# Patient Record
Sex: Female | Born: 1950 | Race: Black or African American | Hispanic: No | Marital: Married | Smoking: Never smoker
Health system: Southern US, Community
[De-identification: ages and names within clinical notes are randomized; demographics above are authoritative.]

## PROBLEM LIST (undated history)

## (undated) DIAGNOSIS — I10 Essential (primary) hypertension: Secondary | ICD-10-CM

## (undated) HISTORY — DX: Essential (primary) hypertension: I10

## (undated) HISTORY — PX: FINGER SURGERY: SHX640

---

## 2013-04-08 ENCOUNTER — Emergency Department (HOSPITAL_COMMUNITY): Payer: No Typology Code available for payment source

## 2013-04-08 ENCOUNTER — Encounter (HOSPITAL_COMMUNITY): Payer: Self-pay | Admitting: Emergency Medicine

## 2013-04-08 ENCOUNTER — Emergency Department (HOSPITAL_COMMUNITY)
Admission: EM | Admit: 2013-04-08 | Discharge: 2013-04-08 | Disposition: A | Discharge status: Home / Self Care | Payer: No Typology Code available for payment source | Attending: Emergency Medicine | Admitting: Emergency Medicine

## 2013-04-08 DIAGNOSIS — S335XXA Sprain of ligaments of lumbar spine, initial encounter: Secondary | ICD-10-CM | POA: Insufficient documentation

## 2013-04-08 DIAGNOSIS — S39012A Strain of muscle, fascia and tendon of lower back, initial encounter: Secondary | ICD-10-CM

## 2013-04-08 DIAGNOSIS — S59909A Unspecified injury of unspecified elbow, initial encounter: Secondary | ICD-10-CM | POA: Insufficient documentation

## 2013-04-08 DIAGNOSIS — S0993XA Unspecified injury of face, initial encounter: Secondary | ICD-10-CM | POA: Insufficient documentation

## 2013-04-08 DIAGNOSIS — I10 Essential (primary) hypertension: Secondary | ICD-10-CM | POA: Insufficient documentation

## 2013-04-08 DIAGNOSIS — S6990XA Unspecified injury of unspecified wrist, hand and finger(s), initial encounter: Secondary | ICD-10-CM | POA: Insufficient documentation

## 2013-04-08 DIAGNOSIS — Y92410 Unspecified street and highway as the place of occurrence of the external cause: Secondary | ICD-10-CM | POA: Insufficient documentation

## 2013-04-08 DIAGNOSIS — Y9389 Activity, other specified: Secondary | ICD-10-CM | POA: Insufficient documentation

## 2013-04-08 MED ORDER — IBUPROFEN 800 MG PO TABS: 800.0000 mg | ORAL_TABLET | Freq: Four times a day (QID) | ORAL | Status: DC | PRN

## 2013-04-08 MED ORDER — HYDROCODONE-ACETAMINOPHEN 5-325 MG PO TABS
1.0000 | ORAL_TABLET | Freq: Once | ORAL | Status: DC
  Filled 2013-04-08: qty 1

## 2013-04-08 MED ORDER — LISINOPRIL 10 MG PO TABS: 10.0000 mg | ORAL_TABLET | Freq: Every day | ORAL | Status: DC

## 2013-04-08 MED ORDER — IBUPROFEN 800 MG PO TABS
800.0000 mg | ORAL_TABLET | Freq: Once | ORAL | Status: AC
  Administered 2013-04-08: 800 mg via ORAL
  Filled 2013-04-08: qty 1

## 2013-04-08 MED ORDER — LISINOPRIL 10 MG PO TABS
10.0000 mg | ORAL_TABLET | Freq: Once | ORAL | Status: AC
  Administered 2013-04-08: 10 mg via ORAL
  Filled 2013-04-08: qty 1
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
## 2013-04-11 NOTE — ED Provider Notes (Signed)
Medical screening examination/treatment/procedure(s) were performed by non-physician practitioner and as supervising physician I was immediately available for consultation/collaboration.    L , MD 04/11/13 1056 

## 2013-05-10 ENCOUNTER — Encounter (HOSPITAL_COMMUNITY): Payer: Self-pay | Admitting: Emergency Medicine

## 2013-05-10 ENCOUNTER — Emergency Department (HOSPITAL_COMMUNITY)
Admission: EM | Admit: 2013-05-10 | Discharge: 2013-05-10 | Disposition: A | Discharge status: Home / Self Care | Payer: BC Managed Care – PPO | Source: Home / Self Care | Attending: Family Medicine | Admitting: Family Medicine

## 2013-05-10 DIAGNOSIS — I10 Essential (primary) hypertension: Secondary | ICD-10-CM

## 2013-05-10 LAB — BASIC METABOLIC PANEL WITH GFR
BUN: 16 mg/dL (ref 6–23)
CO2: 27 meq/L (ref 19–32)
Calcium: 9 mg/dL (ref 8.4–10.5)
Chloride: 104 meq/L (ref 96–112)
Creatinine, Ser: 0.82 mg/dL (ref 0.50–1.10)
GFR calc Af Amer: 88 mL/min — ABNORMAL LOW
GFR calc non Af Amer: 76 mL/min — ABNORMAL LOW
Glucose, Bld: 92 mg/dL (ref 70–99)
Potassium: 4.5 meq/L (ref 3.7–5.3)
Sodium: 141 meq/L (ref 137–147)

## 2013-05-10 MED ORDER — LISINOPRIL 20 MG PO TABS: 20.0000 mg | ORAL_TABLET | Freq: Every day | ORAL | Status: DC
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
## 2013-05-10 NOTE — ED Provider Notes (Signed)
CSN: [id number]     Arrival date & time 05/10/13  1814 History   First MD Initiated Contact with Patient 05/10/13 1927     Chief Complaint  Patient presents with  . Back Pain   (Consider location/radiation/quality/duration/timing/severity/associated sxs/prior Treatment) HPI  63 year old F with hypertension diagnosed 1 month ago.   Hypertension  Home BP monitoring: no  Office BP: BP Readings from Last 3 Encounters:  05/10/13 186/89  04/08/13 185/108    Prescribed meds: lisinopril 10 mg   Hypertension ROS:  Taking medications as prescribed:Yes Chest pain: No Shortness of breath: No Swelling of extremities: No TIA symptoms: No Regular exercise: No Low Na+ diet: No Alcohol/tobacco/drug use: No     History reviewed. No pertinent past medical history. Past Surgical History  Procedure Laterality Date  . Finger surgery     History reviewed. No pertinent family history. History  Substance Use Topics  . Smoking status: Never Smoker   . Smokeless tobacco: Not on file  . Alcohol Use: No   OB History   Grav Para Term Preterm Abortions TAB SAB Ect Mult Living                 Review of Systems  Allergies  Review of patient's allergies indicates no known allergies.  Home Medications   Current Outpatient Rx  Name  Route  Sig  Dispense  Refill  . ibuprofen (ADVIL,MOTRIN) 800 MG tablet   Oral   Take 1 tablet (800 mg total) by mouth every 6 (six) hours as needed.   30 tablet   0   . lisinopril (PRINIVIL,ZESTRIL) 20 MG tablet   Oral   Take 1 tablet (20 mg total) by mouth daily.   30 tablet   0   . tetrahydrozoline 0.05 % ophthalmic solution   Both Eyes   Place 1 drop into both eyes daily.          BP 186/89  Pulse 64  Temp(Src) 98.3 F (36.8 C) (Oral)  Resp 18  SpO2 100% Physical Exam Gen: middle age female, well appearing, NAD, pleasant and conversant CV: RRR, no m/r/g, no JVD or carotid bruits Pulm: normal WOB, CTA-B Abd: soft, NDNT,  NABS Extremities: no edema or joint tenderness   ED Course  Procedures (including critical care time) Labs Review Labs Reviewed  BASIC METABOLIC PANEL   Imaging Review No results found.    MDM   1. Poorly-controlled hypertension    Essential HTN. Recently started on ACE-I. Will check BMP to assure creat and electrolytes stable. Increase lisinopril to 20 mg daily. Follow up with PCP.     Garnetta BuddyEdward V , MD 05/10/13 1950

## 2013-05-10 NOTE — Discharge Instructions (Signed)
Loretta Rangel,   It was nice to meet you. Please start taking the new dose of lisinopril. I will let you know the results of the labs. Please follow up with your regular doctor and read below about a diet to improve your blood pressure.   Sincerely,   Dr. Clinton SawyerWilliamson   DASH Diet The DASH diet stands for "Dietary Approaches to Stop Hypertension." It is a healthy eating plan that has been shown to reduce high blood pressure (hypertension) in as little as 14 days, while also possibly providing other significant health benefits. These other health benefits include reducing the risk of breast cancer after menopause and reducing the risk of type 2 diabetes, heart disease, colon cancer, and stroke. Health benefits also include weight loss and slowing kidney failure in patients with chronic kidney disease.  DIET GUIDELINES  Limit salt (sodium). Your diet should contain less than 1500 mg of sodium daily.  Limit refined or processed carbohydrates. Your diet should include mostly whole grains. Desserts and added sugars should be used sparingly.  Include small amounts of heart-healthy fats. These types of fats include nuts, oils, and tub margarine. Limit saturated and trans fats. These fats have been shown to be harmful in the body. CHOOSING FOODS  The following food groups are based on a 2000 calorie diet. See your Registered Dietitian for individual calorie needs. Grains and Grain Products (6 to 8 servings daily)  Eat More Often: Whole-wheat bread, brown rice, whole-grain or wheat pasta, quinoa, popcorn without added fat or salt (air popped).  Eat Less Often: White bread, white pasta, white rice, cornbread. Vegetables (4 to 5 servings daily)  Eat More Often: Fresh, frozen, and canned vegetables. Vegetables may be raw, steamed, roasted, or grilled with a minimal amount of fat.  Eat Less Often/Avoid: Creamed or fried vegetables. Vegetables in a cheese sauce. Fruit (4 to 5 servings daily)  Eat More  Often: All fresh, canned (in natural juice), or frozen fruits. Dried fruits without added sugar. One hundred percent fruit juice ( cup [237 mL] daily).  Eat Less Often: Dried fruits with added sugar. Canned fruit in light or heavy syrup. Foot LockerLean Meats, Fish, and Poultry (2 servings or less daily. One serving is 3 to 4 oz [85-114 g]).  Eat More Often: Ninety percent or leaner ground beef, tenderloin, sirloin. Round cuts of beef, chicken breast, Mal[place]key breast. All fish. Grill, bake, or broil your meat. Nothing should be fried.  Eat Less Often/Avoid: Fatty cuts of meat, [place]key, or chicken leg, thigh, or wing. Fried cuts of meat or fish. Dairy (2 to 3 servings)  Eat More Often: Low-fat or fat-free milk, low-fat plain or light yogurt, reduced-fat or part-skim cheese.  Eat Less Often/Avoid: Milk (whole, 2%).Whole milk yogurt. Full-fat cheeses. Nuts, Seeds, and Legumes (4 to 5 servings per week)  Eat More Often: All without added salt.  Eat Less Often/Avoid: Salted nuts and seeds, canned beans with added salt. Fats and Sweets (limited)  Eat More Often: Vegetable oils, tub margarines without trans fats, sugar-free gelatin. Mayonnaise and salad dressings.  Eat Less Often/Avoid: Coconut oils, palm oils, butter, stick margarine, cream, half and half, cookies, candy, pie. FOR MORE INFORMATION The Dash Diet Eating Plan: www.dashdiet.org Document Released: 03/17/2011 Document Revised: 06/20/2011 Document Reviewed: 03/17/2011 [facility] Patient Information 2014 [employer] [place].

## 2013-05-12 NOTE — ED Provider Notes (Signed)
Medical screening examination/treatment/procedure(s) were performed by resident physician or non-physician practitioner and as supervising physician I was immediately available for consultation/collaboration.   , DOUGLAS MD.    D , MD 05/12/13 1227 

## 2013-05-14 ENCOUNTER — Encounter: Payer: Self-pay | Admitting: Family Medicine
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
## 2022-12-02 ENCOUNTER — Telehealth: Payer: Self-pay

## 2022-12-02 ENCOUNTER — Encounter: Payer: Self-pay | Admitting: Gynecologic Oncology

## 2022-12-02 NOTE — Telephone Encounter (Signed)
Spoke with [place] regarding her referral to GYN oncology. She has an appointment scheduled with Dr. Pricilla Holm on 12/08/22 at 10:30. Patient agrees to date and time. She has been provided with office address and location. She is also aware of our mask and visitor policy. Patient verbalized understanding and will call with any questions.

## 2022-12-02 NOTE — Telephone Encounter (Signed)
Left message for patient to call back and schedule new patient appointment.  Try to schedule for Dr Pricilla Holm on 8/29.Marland Kitchen

## 2022-12-06 ENCOUNTER — Other Ambulatory Visit: Payer: Self-pay | Admitting: Gynecologic Oncology

## 2022-12-06 ENCOUNTER — Telehealth: Payer: Self-pay | Admitting: Oncology

## 2022-12-06 DIAGNOSIS — C541 Malignant neoplasm of endometrium: Secondary | ICD-10-CM

## 2022-12-06 NOTE — Telephone Encounter (Signed)
Called Loretta Rangel back and advised her of the CT appointment for 12/13/22 with 8:45 am arrival at [facility].  Let her know that we will review instructions for the scan when she is here for her appointment with Dr. Pricilla Holm on Thursday.

## 2022-12-06 NOTE — Progress Notes (Signed)
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

## 2022-12-06 NOTE — Telephone Encounter (Signed)
Called Loretta Rangel and let her know that we are working on getting things arranged for her.  Let her know that we are going to go ahead and order a CT scan for her and will call her back with the appointment.  Also advised that we are planning for surgery tentatively on 12/15/22 and she said that date works for her.  Discussed this can always be adjusted if needed but that we wanted to hold the time for her.

## 2022-12-07 ENCOUNTER — Telehealth: Payer: Self-pay | Admitting: Oncology

## 2022-12-07 ENCOUNTER — Encounter: Payer: Self-pay | Admitting: Gynecologic Oncology

## 2022-12-07 ENCOUNTER — Telehealth: Payer: Self-pay

## 2022-12-07 NOTE — Telephone Encounter (Signed)
Called Loretta Rangel and advised her insurance is out of network with Dr. Pricilla Holm and that we have reached out to the referring office to refer her to [facility].  Advised we have canceled her appointment with Dr. Pricilla Holm tomorrow, the CT scan for 9/3 and surgery for 9/5.  She verbalized agreement.

## 2022-12-07 NOTE — Telephone Encounter (Signed)
Called [employer] to see if [facility] and Dr. Pricilla Holm are in network for patient's plan.  Both are out of network for patient's plan.  They did check for in network Gyn Oncologists and [facility] on [place]. [place]. In [place] and Dr. Flint Melter are in network.

## 2022-12-07 NOTE — Progress Notes (Unsigned)
GYNECOLOGIC ONCOLOGY NEW PATIENT CONSULTATION   Patient Name: Loretta Rangel  Patient Age: 72 y.o. Date of Service: 12/07/22 Referring Provider: Damaris Hippo, MD  Primary Care Provider: No primary care provider on file. Consulting Provider: Eugene Garnet, MD   Assessment/Plan:  ***   We reviewed the nature of endometrial cancer and its recommended surgical staging, including total hysterectomy, bilateral salpingo-oophorectomy, and lymph node assessment. The patient is a suitable candidate for staging via a minimally invasive approach to surgery.  We reviewed that robotic assistance would be used to complete the surgery.   We discussed that most endometrial cancer is detected early, however, we reviewed that adjuvant therapy will likely be recommended based on the patient's biopsy, however, we will defer to final pathology results.    Given her high risk histology, I recommend CT scan preoperatively to rule out metastatic disease. This has already been scheduled on 9/3.  We reviewed the sentinel lymph node technique. Risks and benefits of sentinel lymph node biopsy was reviewed. We reviewed the technique and ICG dye. ***The patient DOES NOT have an iodine allergy or known liver dysfunction. We reviewed the false negative rate (0.4%), and that 3% of patients with metastatic disease will not have it detected by SLN biopsy in endometrial cancer. A low risk of allergic reaction to the dye, <0.2% for ICG, has been reported. We also discussed that in the case of failed mapping, which occurs 40% of the time, a bilateral or unilateral lymphadenectomy will be performed at the surgeon's discretion.   Potential benefits of sentinel nodes including a higher detection rate for metastasis due to ultrastaging and potential reduction in operative morbidity. However, there remains uncertainty as to the role for treatment of micrometastatic disease. Further, the benefit of operative morbidity associated with  the SLN technique in endometrial cancer is not yet completely known. In other patient populations (e.g. the cervical cancer population) there has been observed reductions in morbidity with SLN biopsy compared to pelvic lymphadenectomy. Lymphedema, nerve dysfunction and lymphocysts are all potential risks with the SLN technique as with complete lymphadenectomy. Additional risks to the patient include the risk of damage to an internal organ while operating in an altered view (e.g. the black and white image of the robotic fluorescence imaging mode).   The patient was consented for a robotic assisted hysterectomy, bilateral salpingo-oophorectomy, sentinel lymph node evaluation, possible lymph node dissection, possible laparotomy. The risks of surgery were discussed in detail and she understands these to include infection; wound separation; hernia; vaginal cuff separation, injury to adjacent organs such as bowel, bladder, blood vessels, ureters and nerves; bleeding which may require blood transfusion; anesthesia risk; thromboembolic events; possible death; unforeseen complications; possible need for re-exploration; medical complications such as heart attack, stroke, pleural effusion and pneumonia; and, if full lymphadenectomy is performed the risk of lymphedema and lymphocyst. The patient will receive DVT and antibiotic prophylaxis as indicated. She voiced a clear understanding. She had the opportunity to ask questions. Perioperative instructions were reviewed with her. Prescriptions for post-op medications were sent to her pharmacy of choice.  A copy of this note was sent to the patient's referring provider.   *** minutes of total time was spent for this patient encounter, including preparation, face-to-face counseling with the patient and coordination of care, and documentation of the encounter.  Eugene Garnet, MD  Division of Gynecologic Oncology  Department of Obstetrics and Gynecology  [facility]  ___________________________________________  Chief Complaint: No chief complaint on file.  History of Present Illness:  Loretta Rangel is a 72 y.o. y.o. female who is seen in consultation at the request of Dr. Lorane Gell for an evaluation of endometrial cancer.  The patient initially presented in mid August with intermittent bleeding since 12/2021 that had increased in quantity and frequency since 06/2022. Pelvic ultrasound at her clinic visit showed endometrium measured 33.3 mm. 5.8 cm subserosal fibroid seen as well as a 2.9 cm lower uterine segment fibroid. Ovaries were not visualized. No free fluid.   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 Given bleeding, she was given a 10 day prescription of 10mg  Provera.   PAST MEDICAL HISTORY:  Past Medical History:  Diagnosis Date   Hypertension      PAST SURGICAL HISTORY:  Past Surgical History:  Procedure Laterality Date   CESAREAN SECTION     1979   FINGER SURGERY      OB/GYN HISTORY:  OB History  No obstetric history on file.    No LMP recorded. Patient is postmenopausal.  Age at menarche: ***  Age at menopause: late 71s Hx of HRT: *** Hx of STDs: *** Last pap: many years ago, did not get regularly History of abnormal pap smears: ***  SCREENING STUDIES:  Last mammogram: never had  Last colonoscopy:  never had  Last bone mineral density: ***  MEDICATIONS: Outpatient Encounter Medications as of 12/08/2022  Medication Sig   Cholecalciferol (D 1000) 25 MCG (1000 UT) capsule Take by mouth.   ibuprofen (ADVIL,MOTRIN) 800 MG tablet Take 1 tablet (800 mg total) by mouth every 6 (six) hours as needed.   lisinopril (PRINIVIL,ZESTRIL) 20 MG tablet Take 1 tablet (20 mg total) by mouth daily.   medroxyPROGESTERone (PROVERA) 10 MG tablet Take 10 mg by mouth daily.   [DISCONTINUED] tetrahydrozoline 0.05 % ophthalmic solution Place 1 drop into both eyes daily.    No facility-administered encounter medications on file as of 12/08/2022.    ALLERGIES:  No Known Allergies   FAMILY HISTORY:  Family History  Problem Relation Age of Onset   Colon cancer Neg Hx    Breast cancer Neg Hx    Ovarian cancer Neg Hx    Endometrial cancer Neg Hx    Pancreatic cancer Neg Hx    Prostate cancer Neg Hx      SOCIAL HISTORY:  Social Connections: Not on file    REVIEW OF SYSTEMS:  Denies appetite changes, fevers, chills, fatigue, unexplained weight changes. Denies hearing loss, neck lumps or masses, mouth sores, ringing in ears or voice changes. Denies cough or wheezing.  Denies shortness of breath. Denies chest pain or palpitations. Denies leg swelling. Denies abdominal distention, pain, blood in stools, constipation, diarrhea, nausea, vomiting, or early satiety. Denies pain with intercourse, dysuria, frequency, hematuria or incontinence. Denies hot flashes, pelvic pain, vaginal bleeding or vaginal discharge.   Denies joint pain, back pain or muscle pain/cramps. Denies itching, rash, or wounds. Denies dizziness, headaches, numbness or seizures. Denies swollen lymph nodes or glands, denies easy bruising or bleeding. Denies anxiety, depression, confusion, or decreased concentration.  Physical Exam:  Vital Signs for this encounter:  There were no vitals taken for this visit. There is no height or weight on file to calculate BMI. General: Alert, oriented, no acute distress.  HEENT: Normocephalic, atraumatic. Sclera anicteric.  Chest: Clear to auscultation bilaterally. No wheezes, rhonchi, or rales. Cardiovascular: Regular rate and rhythm, no murmurs, rubs, or gallops.  Abdomen: ***Obese. Normoactive bowel sounds. Soft, nondistended, nontender to  palpation. No masses or hepatosplenomegaly appreciated. No palpable fluid wave.  Extremities: Grossly normal range of motion. Warm, well perfused. No edema bilaterally.  Skin: No rashes or lesions.  Lymphatics:  No cervical, supraclavicular, or inguinal adenopathy.  GU:  Normal external female genitalia. ***  No lesions. No discharge or bleeding.             Bladder/urethra:  No lesions or masses, well supported bladder             Vagina: ***             Cervix: Normal appearing, no lesions.             Uterus: *** Small, mobile, no parametrial involvement or nodularity.             Adnexa: *** masses.  Rectal: ***  LABORATORY AND RADIOLOGIC DATA:  ***Outside medical records were reviewed to synthesize the above history, along with the history and physical obtained during the visit.   Lab Results  Component Value Date   GLUCOSE 92 05/10/2013   NA 141 05/10/2013   K 4.5 05/10/2013   CL 104 05/10/2013   CREATININE 0.82 05/10/2013   BUN 16 05/10/2013   CO2 27 05/10/2013

## 2022-12-07 NOTE — Telephone Encounter (Signed)
Spoke with Transport planner at [facility].  Advised her that Dr Pricilla Holm is OON for this patient.  Cancelled her CT scan, cancelled her surgery, and the referral.  Lupita Leash confirmed and understood.

## 2022-12-08 ENCOUNTER — Inpatient Hospital Stay: Payer: Medicare Other | Admitting: Gynecologic Oncology

## 2022-12-13 ENCOUNTER — Other Ambulatory Visit (HOSPITAL_COMMUNITY): Payer: Medicare Other

## 2022-12-13 ENCOUNTER — Ambulatory Visit (HOSPITAL_COMMUNITY): Payer: Medicare Other

## 2022-12-15 ENCOUNTER — Inpatient Hospital Stay: Admit: 2022-12-15 | Payer: Medicare Other | Admitting: Gynecologic Oncology

## 2022-12-15 SURGERY — XI ROBOTIC ASSISTED TOTAL HYSTERECTOMY BILATERAL SALPINGO OOPHORECTOMY WITH OMENTECTOMY AND DEBULKING
Anesthesia: General

## 2024-02-13 NOTE — H&P (Signed)
 ------------------------------------------------------------------------------- Attestation signed by Wilnette Overman, MD at 02/13/2024  8:09 PM Case discussed with PA, I agree with PA's  plan of care.  I did not see and examine the patient today.  I was available for all questions and concerns.  Billing as per PA.  -------------------------------------------------------------------------------   HOSPITAL MEDICINE - ADMISSION NOTE  Primary Care Physician Avelina Cottie Murray, PA-C  Primary Contact Extended Emergency Contact Information Primary Emergency Contact: Lasure,Ray Mobile Phone: [phone] Relation: Spouse   Chief Complaint Lab abnormalities and abdominal pain  History of Present Illness   Loretta Rangel is a 73 y.o. female with history of endometrial/uterine cancer who was referred by her oncology clinic due to hemoglobin of 6.4, potassium 6.2 and a creatinine of 2.  The patient was seen 5 days ago in the clinic with a hemoglobin of 6.1 and received 1 packed of red blood cells.  Repeat follow-up labs today in the clinic revealed no change, and the patient was brought to the ED with a blood transfusion actively going.  Patient is companied by her daughter today, patient states that she would like to have some answers due to this being a cyclical event.  The patient was post to have her first dose of chemotherapy today however cannot seem to get her hemoglobin and lab values stabilized.  The patient is also complaining of abdominal discomfort and is requesting a CT scan to be completed.  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  Patient has been admitted to the general medicine team for electrolyte imbalances and management of chronic disease processes.  This provider talk to patient's oncologist Dr. Fernand who recommended inpatient nephrology get involved due to patient's history of  urethral stent, persistent AKI and hyperkalemia.  Inpatient nephrology has recommended a bilateral renal ultrasound on 11//2025.  Due to patient's recurrent anemia, a DIC panel, haptoglobin, and LDH have also been obtained to look for hemolysis.   Review of Systems 10 point review of negative unless noted in HPI   PMFSH and Home Medications   Past Medical History  Medical History[1]  Surgical History Surgical History[2]   Family History Family History[3]  Social History Social History[4]   Allergies Patient has no known allergies.  Medications Prescriptions Prior to Admission[5]   Objective   Temp:  [97.4 F (36.3 C)-98.1 F (36.7 C)] 97.4 F (36.3 C) Heart Rate:  [93-110] 96 Resp:  [15-26] 15 BP: (95-110)/(63-77) 110/77    Physical Exam Vitals and nursing note reviewed.  Constitutional:      General: She is not in acute distress.    Appearance: She is obese. She is ill-appearing.  Cardiovascular:     Rate and Rhythm: Normal rate and regular rhythm.     Pulses: Normal pulses.     Heart sounds: Normal heart sounds, S1 normal and S2 normal.  Pulmonary:     Effort: Pulmonary effort is normal. No accessory muscle usage or respiratory distress.     Breath sounds: Normal breath sounds and air entry. No wheezing or rales.  Abdominal:     General: Abdomen is protuberant. Bowel sounds are normal. There is distension.     Tenderness: There is generalized abdominal tenderness.     Comments: No signs of peritonitis  Musculoskeletal:     Right lower leg: No edema.     Left lower leg: No edema.  Skin:    General: Skin is warm.  Neurological:     General: No focal deficit present.     Mental Status: She is alert and oriented to person, place, and time.  Psychiatric:        Mood and Affect: Mood normal.        Behavior: Behavior normal.        Thought Content: Thought content normal.        Judgment: Judgment normal.     Results from last 7 days  Lab Units  02/13/24 1414 02/13/24 0827 02/08/24 0823  WHITE BLOOD CELL COUNT 10*3/uL  --  9.11 8.62  HEMOGLOBIN g/dL 7.3* 6.4* 6.1*  HEMATOCRIT % 22.0* 19.9* 18.2*  PLATELET COUNT 10*3/uL  --  440 357    Results from last 7 days  Lab Units 02/13/24 1015 02/13/24 0827  SODIUM mmol/L 131* 132*  POTASSIUM mmol/L 6.2* 6.1*  CHLORIDE mmol/L 101 102  CO2 mmol/L 27 26  BUN mg/dL 38* 38*  CREATININE mg/dL 8.02* 7.97*  GLUCOSE mg/dL 897* 895*  CALCIUM mg/dL 8.5* 8.5*    Results from last 7 days  Lab Units 02/13/24 0827 02/08/24 0823  MAGNESIUM mg/dL 2.2 2.0    No results found for this visit on 02/13/24 (from the past week).   CT Abdomen Pelvis WO Contrast Result Date: 02/13/2024 CONCLUSION: 1. Probable similar intraperitoneal tumor implants. 2. Increased ascites. 3. 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    Assessment and Plan   Assessment & Plan Anemia Uterine cancer    (CMD) Anemia of chronic disease - Patient is s/p 2 units of PRBC's with a current hemoglobin of 7.4 as of 1607 02/13/2024.  - Patient's ferritin is super elevated at 849, most likely due to anemia of chronic disease -Patient CA125 level is 235 according to 11//2025 labs -This provider spoke to Dr. Fernand who states that she is afraid this is all linked to her progression of disease PLAN: - Maintain hemoglobin above 7, repeat H&H every 8 hours - DIC panel, LDH, haptoglobin are in process. Hyperkalemia - Patient's potassium as of 1015 02/13/2024 is 6.2 -Patient was given Lokelma and calcium gluconate around 1330 -repeat  6.1 - Another 10mg  lokelma was ordered at 1730, repeat BMP at 1800 and 2100 -Low threshold for hyperkalemia protocol Ascites - Patient CT scan showed increased ascites -most likely secondary to malignancy -Cannot start spironolactone due to hyperkalemia -Possible diagnostic/therapeutic paracentesis in future for symptomatic management -cannot do today due to acute blood  loss History of pulmonary embolus (PE) - Was on Eliquis for history of PE, however due to acute blood loss has been held.  DVT Prophylaxis: Contraindicated - bleeding. SCDs only  CODE STATUS: Full Code. Information obtained from patient  Could Adeana Amylynn Fano be considered for Hospital at Home in the next 24 hours? no   Anticipate > 2 midnight stay, patient will be admitted as inpatient for AnemiaEstimated date of discharge Feb 16, 2024   This note was completed using the Dragon dictation system, which may account for spelling errors, erroneous wording and other inconsistencies.    Roberta Prentice Lee, PA-C         [1] Past Medical History: Diagnosis Date   Hypertension    Seasonal allergies    Uterine carcinoma    (CMD)   [2] Past Surgical History: Procedure Laterality Date   CARDIAC CATHETERIZATION Right 01/29/2024   CV CATHETERIZATION RIGHT HEART performed by Glendia Tanda Ee, MD at [facility] INVASIVE LAB   CESAREAN SECTION  FINGER SURGERY Left    pinky   INVASIVE VASCULAR PROCEDURE N/A 01/29/2024   CV VENOGRAM INFERIOR VENA CAVA performed by Glendia Tanda Ee, MD at [facility] INVASIVE LAB   INVASIVE VASCULAR PROCEDURE Bilateral 01/29/2024   Pulmonary angiogram performed by Glendia Tanda Ee, MD at [facility] INVASIVE LAB   ROBOTIC ASSISTED HYSTERECTOMY N/A 01/10/2023   ROBOTIC ASSISTED TOTAL LAPAROSCOPIC HYSTERECTOMY, BSO, BILATERAL SLN, OMENTAL BIOPSY performed by Therisa Lanna Ades, MD at [facility] OR   URETERAL STENT PLACEMENT N/A 01/18/2024   RIGHT CYSTOSCOPY WITH STENT INSERTION/EXCHANGE WITH OR WITHOUT RETROGRADE PYELOGRAM performed by Amr Sherif Mohamed Rifaa Margart Sierras, MD at [facility] OR  [3] Family History Problem Relation Name Age of Onset   Hypertension Mother     Stroke Mother     Heart failure Mother     Breast cancer Neg Hx     Ovarian cancer Neg Hx     Uterine cancer Neg Hx    [4] Social History Socioeconomic History   Marital status:  Married  Tobacco Use   Smoking status: Never   Smokeless tobacco: Never  Substance and Sexual Activity   Alcohol use: Not Currently   Drug use: Never   Sexual activity: Never   Social Drivers of Health   Food Insecurity: Low Risk  (01/26/2024)   Food vital sign    Within the past 12 months, you worried that your food would run out before you got money to buy more: Never true    Within the past 12 months, the food you bought just didn't last and you didn't have money to get more: Never true  Transportation Needs: No Transportation Needs (01/26/2024)   Transportation    In the past 12 months, has lack of reliable transportation kept you from medical appointments, meetings, work or from getting things needed for daily living? : No  Safety: Low Risk  (02/08/2024)   Safety    How often does anyone, including family and friends, physically hurt you?: Never    How often does anyone, including family and friends, insult or talk down to you?: Never    How often does anyone, including family and friends, threaten you with harm?: Never    How often does anyone, including family and friends, scream or curse at you?: Never  Living Situation: Low Risk  (01/26/2024)   Living Situation    What is your living situation today?: I have a steady place to live    Think about the place you live. Do you have problems with any of the following? Choose all that apply:: None/None on this list  [5] (Not in a hospital admission)

## 2024-02-15 NOTE — Progress Notes (Signed)
 NEPHROLOGY PROGRESS NOTE  ASSESSMENT/PLAN: AKI from intra-abdominal hypertension and bilateral hydronephrosis.  Status post 3.2 L paracentesis yesterday. BUN/creatinine 32/1.53, trending down.  K+ 5.2.  Urine output not recorded.  Remains on NS at 75 mL/h.  Will repeat renal ultrasound to follow-up her hydronephrosis.  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  Baseline creatinine close to 0.85. Prior history of right hydronephrosis, decompressed with double-J stent. Hyperkalemia, a bit improved.  K+ 5.2.  Will give more Lokelma. Malignant ascites with increased intra-abdominal pressure.  Status post paracentesis.  Yielded 3.2 L of dark red thin fluid.  No immediate complications. Uterine/endometrial  cancer with intra-abdominal metastasis. Anemia.  Patient Loretta Rangel was seen and examined.  Patient Active Problem List   Diagnosis Date Noted   Anemia 02/13/2024   Hyperkalemia 02/13/2024   Ascites 02/13/2024   History of pulmonary embolus (PE) 02/13/2024   Hypoxemia 01/26/2024   Hypotension 01/26/2024   Pulmonary embolism, bilateral    (CMD) 01/26/2024   AKI 01/26/2024   Hypomagnesemia 01/26/2024   Hydronephrosis of right kidney 01/15/2024   Anemia of chronic disease 01/11/2024   Dehydration 05/19/2023   Iron deficiency 05/12/2023   Cancer related pain 05/01/2023   Other constipation 05/01/2023   Endometrial cancer 12/27/2022   Uterine cancer    (CMD)    Vitamin D deficiency 11/22/2022   Essential hypertension 11/21/2022     INTERVAL HISTORY:  S/P paracentesis. Feels a lot better. Appetite improving.  MEDICATIONS: Current Medications[1]  PHYSICAL EXAM: Vitals: Temp:  [97.9 F (36.6 C)-98.4 F (36.9 C)] 98.4 F (36.9 C) Heart Rate:  [58-104] 96 Resp:  [12-19] 16 BP: (93-109)/(57-77) 96/67  WEIGHTS: Admission Weight: 71.8 kg (158 lb 4.6 oz)documented Weight: 70.9 kg (156 lb 4.9 oz) Weight change: -0.9  kg (-1 lb 15.7 oz)  Physical Exam: General: NAD HEENT: Mucus membranes moist. Respiratory: Lungs clear. Cardiac: Normal S1&S2.  GI/Abd: Soft, normalBS. Abd less distended Urogenital: Deferred Musculoskeletal: No palpable edema. Skin: Warm and dry. Neuro: Alert, no focal deficit. Looks more cheerful.   LAB DATA: Data Review:  Labs and Studies from the last 24hrs per EMR and Personally Reviewed by me Imaging: Pending  Lab Results  Component Value Date   NA 137 02/15/2024   K 5.2 (H) 02/15/2024   CL 107 02/15/2024   BUN 32 (H) 02/15/2024   CREATININE 1.53 (H) 02/15/2024   GLUCOSE 77 02/15/2024   Lab Results  Component Value Date   HGB 6.7 (LL) 02/15/2024   HCT 20.2 (L) 02/15/2024   WBC 8.56 02/15/2024   No results found for: PHOS                  [1] Current Facility-Administered Medications  Medication Dose Route Frequency Provider Last Rate Last Admin   acetaminophen  (TYLENOL ) tablet 1,000 mg  1,000 mg oral Q6H Haden Prentice Lee, PA-C   1,000 mg at 02/15/24 0437   acetaminophen  (TYLENOL ) tablet 650 mg  650 mg oral Once PRN Valla JENEANE Sherry, MD       dextrose (D50W) 50 % injection 12.5 g  12.5 g intravenous PRN Roberta Prentice Lee, PA-C       dextrose (GLUTOSE) 40 % oral gel 15 g  15 g oral PRN Roberta Prentice Lee, PA-C       diphenhydrAMINE (BENADRYL) tablet/capsule 25 mg  25 mg oral Once PRN Mahvesh Z Lateef, MD       furosemide (LASIX) injection 20 mg  20 mg intravenous Once  Valla JENEANE Sherry, MD       loperamide (IMODIUM) capsule 2 mg  2 mg oral 4x Daily PRN Roberta Prentice Lee, PA-C       magnesium oxide 400 mg (241 mg magnesium) tablet 400 mg  400 mg oral Daily Haden Andrew Baker, PA-C   400 mg at 02/15/24 0821   OLANZapine (ZyPREXA) tablet 10 mg  10 mg oral At Bedtime Roberta Prentice Lee, PA-C   10 mg at 02/14/24 2057   ondansetron (ZOFRAN-ODT) disintegrating tablet 8 mg  8 mg oral Q8H PRN Roberta Prentice Lee, PA-C       oxyCODONE  (ROXICODONE) immediate release tablet 5 mg  5 mg oral Q4H PRN Roberta Prentice Lee, PA-C   5 mg at 02/14/24 0959   polyethylene glycol (GLYCOLAX) packet 17 g  17 g oral Daily Roberta Prentice Lee, PA-C   17 g at 02/15/24 9178   sodium chloride 0.9 % infusion  75 mL/hr intravenous Continuous Roberta Prentice Lee, PA-C 75 mL/hr at 02/15/24 0450 75 mL/hr at 02/15/24 0450   sodium chloride 0.9 % infusion  30 mL/hr intravenous Continuous PRN Mahvesh Z Lateef, MD       tamsulosin (FLOMAX) 24 hr capsule 0.4 mg  0.4 mg oral Daily Roberta Prentice Lee, PA-C   0.4 mg at 02/15/24 [id number]

## 2024-02-17 NOTE — Discharge Summary (Signed)
 Discharge Summary   PATIENT NAME:  Loretta Rangel DOB:  [birth year] MRN:  [id number]   Admission Date:   02/13/2024 11:50 AM                     Discharge Date:   02/17/2024                 Consultants: Nephrology Urology   DISCHARGE DIAGNOSIS: Principal Problem (Resolved):   Acute on chronic anemia Active Problems:   Acute on chronic anemia   Essential hypertension   Uterine cancer    (CMD)   Endometrial cancer   Hyperkalemia   Ascites   History of pulmonary embolus (PE) Resolved Problems:   AKI   Pulmonary embolism, bilateral    (CMD)   Patient seen and examined with the assistance of the nurse and a virtual cart.  Hospital Course:  For the details of admission, please see the H&P .  For full details, please see progress notes, consult notes and ancillary notes.  Patient is a 73 years old female with past medical history of advanced endometrial/uterine cancer, obstructive uropathy status post stent placed in October by urology for hydronephrosis.  Who was referred to ER from her oncologist office due to hemoglobin of 6.4, potassium of 6.1 and serum creatinine of 2.  Patient was seen at the clinic 5 days prior to admission and was noted to be anemic with hemoglobin of 6.1, she received 1 unit of PRBC transfusion.  Her repeat labs on the day of admission at her oncologist office continue to show anemia with worsening renal function and hyperkalemia.  She was started on blood transfusion and sent to the ER.  Per documentation it appears that patient was supposed to get her chemotherapy however it was held due to ongoing anemia, AKI and hyperkalemia.  And also reported increasing abdominal distention for last several days.  In ED she underwent CT abdomen pelvis without contrast that showed increased ascites along with depressed right renal collecting system with stents in place.  Patient was admitted to general medicine service for electrolyte abnormalities, AKI and acute on chronic  anemia.  Nephrology and urology were consulted.  Patient was admitted to medical telemetry floor.  She received 2 units of PRBC transfusion on 11/6.  Hemoglobin has improved and has remained stable since transfusion.  Prior to discharge her hemoglobin is 8.9 g.  Acute anemia is suspected likely related to ongoing blood loss from vaginal bleeding in setting of endometrial/uterine cancer.  Patient also has history of pulmonary embolism, stopped taking Eliquis 2 days ago.  Due to acute blood loss anemia Eliquis has been held during hospitalization after discussion with Dr. Fernand prior to discharge patient has been restarted back on Eliquis however at 2.5 mg p.o. twice daily without loading dose.  She is also been started on oral iron supplements.  She remains at high risk for bleeding however due to history of recent extensive DVT and PE risk of stopping anticoagulation is high.  Family understand that patient remains at high risk for bleeding due to blood loss anemia which can be worsened by Eliquis. Spoke with husband and updated him regarding Dr. Kathern recommendations.    Regarding AKI serum creatinine was noted to be 2.02 with potassium of 5.2.  Nephrology was consulted.  Urology evaluated patient on 10/5 and recommended outpatient follow-up in 6-week for right ureteral stent exchange.  Per urology left-sided hydronephrosis that was seen on ultrasound is unremarkable if patient  has extrarenal pelvis and therefore no treatment is needed at this time.  Urology planning to arrange outpatient follow-up in 6 weeks for right ureteral stent exchange.  Patient underwent paracentesis twice first on 11/5 with removal of 3.2 L of bloody peritoneal fluid and once again on 11/7 with removal of 1.4 L.  Per nephrology recurrent ascites is likely contributing to AKI.  Repeat ultrasound done on 11/6 showed interval development of mild left hydronephrosis.  Renal pelvis is dilated measuring up to 1.9 cm.  Slight leak eventuated  due to extrarenal pelvis.  Suspect patient will continue to have issues with fluid accumulation/ascites and will require recurrent paracentesis as outpatient.  Recommend outpatient discussion regarding PleurX catheter.  Patient has been given Lokelma for hyperkalemia.  Potassium was 4.6 on 11/7 however it is 5.0 on 11/8.  She will be given a dose of Lokelma prior to discharge.  Patient overall feels well.  Plan of care and discharge was discussed in detail with nephrology prior to discharge.  Patient is being discharged in a stable and improved condition.  Discharge instructions and plans were discussed with patient's husband Mr. Loretta Rangel prior to discharge.  Email regarding patient's hospitalization has been sent to patient's PCP.    Acute on chronic anemia -Presented with hemoglobin of 6.4, status post 2 units PRBC transfusion, repeat hemoglobin 7.0.  Most likely this is chronic blood loss from vaginal bleeding in addition to poor oral intake/nutritional deficiency.  Been on Eliquis in the past however it has been held due to acute anemia -11/6: Hemoglobin trended down to 6.7 g.  Ordered 2 unit PRBC blood transfusion with goal to keep hemoglobin 7 g and above -11/7: Hemoglobin up to 9.40 g -11/8: Hemoglobin 8.9 g -Recommend to continue to follow hemoglobin closely after discharge.  Eliquis was held initially but after discussion with patient's primary oncologist Dr. Fernand prior to discharge patient has been started on low-dose Eliquis 2.5 mg p.o. twice daily with daily oral iron supplement.  She remains at high risk for blood loss anemia and really continued requirement for blood transfusions periodically.     AKI -Suspect obstructive uropathy related, she had right-sided JJ stent placed on 10/25 for hydronephrosis.   -Renal ultrasound showed bilateral hydronephrosis, urology consulted, urology evaluated patient on 11/5.  Per urology do not think left-sided hydronephrosis is obstructive and has  mild, no need for urgent stent placement.  Urology recommending outpatient follow-up in 6 weeks.  -Nephrology consulted -Creatinine slowly trending up, was 1.08 on 10/23, 2.02 on presentation, 1.95 on 11/5, now down to 1.53.  Potassium 5.2 -11/7: Serum creatinine 1.24, GFR 46 down from serum creatinine of 1.37. Appreciate nephrology.  Urology has recommended repeat paracentesis (orders placed).  IVF discontinued -Underwent paracentesis on 11/5 with removal of 3.2 L of bloody peritoneal fluid.   -Repeat renal US  done on 11/6 showed interval development of mild left hydronephrosis.  Renal pelvis is dilated measuring up to 1.9 cm, slightly eventuated due to extrarenal pelvis.   -11/7: Underwent therapeutic paracentesis again with removal of 1.4 L of peritoneal fluid -11/8: Serum creatinine 1.14 with potassium of 5.0.  Discussed with nephrology patient is okay for discharge from their perspective.  She will need outpatient follow-up with urology for stent exchange.  Will defer appointment to urology (patient is planning to transition to urology at [facility], was seen by Dr. Chauncey on 11/5).  Patient has also been given a dose of Lokelma prior to discharge for potassium of 5.0 -  May consider outpatient follow-up with nephrology, will defer to primary care at this time -Continue home dose of Flomax   History of pulmonary embolus (PE) -CTA chest on 01/26/2024 showed bilateral pulmonary embolism substantially worse on the right where clot burden extends to right pulmonary artery.  Findings were compatible with right heart strain.  Patient underwent large-volume thromboembolectomy with improvement from severe to moderate pulmonary hypertension, improved distal perfusion and drastic improvement in oxygenation.  Patient also had evidence of common femoral DVT on the right side with patent IVC. -Had been on Eliquis however it has been held in light of ongoing anemia (per patient she stopped taking Eliquis 2 days  ago) - 11/7 :discussed with patient and her daughter in detail the risks and benefits associated with anticoagulation.  Patient is at high risk for thromboembolism due to history of PE and underlying advanced malignancy however in light of ongoing anemia cannot be safely started back on anticoagulation at this time.  Current plan is to continue to hold Eliquis, close follow-up with patient's primary oncologist and PCP and if hemoglobin has stabilized then may consider restarting Eliquis once again. -11/8: Discussed with patient's husband Mr. Indiah Heyden regarding the risks associated with anticoagulation in setting of acute anemia.  He understands that patient is high risk for anticoagulation at this time and remains high risk for thromboembolism without Eliquis.  Case with patient's primary oncologist Dr. Fernand who has recommended to decrease Eliquis to 2.5 mg p.o. twice daily and start patient on oral iron supplement.   Ascites -CT scan showed increased ascites, secondary to malignancy/metastatic uterine cancer -Status post paracentesis with removal of 3.2 L of peritoneal fluid on 11/5 -Status post paracentesis with removal of 1.4 L of peritoneal fluid on 11/7 -Suspect patient will require recurrent paracentesis as outpatient as well.  May consider PleurX catheter.  Briefly discussed this option with patient's husband and recommended to discuss it further with oncology   Hyperkalemia -Suspect secondary to AKI -11/6 potassium 5.2, continue Lokelma.  Nephrology -11/7 potassium 4.6 -11/8 potassium 5.0, ordered 1 dose of Lokelma.  Recommended patient to follow low potassium diet   Uterine cancer    (CMD) -History of metastatic uterine cancer which is progressive as per Dr. Kathern note outpatient recently, she was not able to start on chemotherapy on 11/4 secondary to anemia and AKI/hyperkalemia. -Patient follows with GYN Onc, Dr. Thornton oncology Dr. Fernand -Per documentation patient plans to continue  chemotherapy as tolerated outpatient and wants all aggressive treatment -Patient will likely benefit from palliative care consult to discuss CODE STATUS and goals of care, will defer to oncology  Discharge Medications:   Discharge Medications     New Medications      Sig Disp Refill Start End  apixaban 2.5 mg Tab Commonly known as: ELIQUIS Replaces: apixaban 5 mg (74 tabs) Dspk dose pack  Take 1 tablet (2.5 mg total) by mouth 2 (two) times a day.  60 tablet  0     ferrous gluconate 324 mg (38 mg iron) tablet  Take 1 tablet (324 mg total) by mouth daily.  90 tablet  0         Medications To Continue      Sig Disp Refill Start End  acetaminophen  500 mg tablet Commonly known as: TYLENOL   Take 2 tablets (1,000 mg total) by mouth every 6 (six) hours.   0     BisaCODYL 5 mg EC tablet Commonly known as: DULCOLAX  Take 5 mg by  mouth daily as needed for constipation.   0     loperamide 2 mg capsule Commonly known as: IMODIUM  Take 2 mg by mouth as needed in the morning and 2 mg as needed at noon and 2 mg as needed in the evening and 2 mg as needed before bedtime for diarrhea.   0     magnesium oxide 400 mg (241 mg magnesium) Tab  Take 1 tablet (400 mg total) by mouth daily.  30 tablet  1     OLANZapine 5 mg tablet Commonly known as: ZyPREXA  Take 2 tablets (10 mg total) by mouth at bedtime.  60 tablet  0     ondansetron 8 mg disintegrating tablet Commonly known as: ZOFRAN-ODT  Dissolve 1 tablet (8 mg total) by mouth every 8 hours as needed (chemo induced nausea/vomiting)  30 tablet  3     oxyCODONE 5 mg immediate release tablet Commonly known as: ROXICODONE  Take 1-2 tablets (5-10 mg total) by mouth every 4 (four) hours as needed for moderate pain (4-6).  90 tablet  0     prochlorperazine 10 mg tablet Commonly known as: COMPAZINE  Take 1 tablet (10 mg total) by mouth every 6 (six) hours as needed (chemo induced nausea/vomiting)  30 tablet  3      tamsulosin 0.4 mg Cap Commonly known as: FLOMAX  TAKE 1 CAPSULE BY MOUTH EVERY DAY  30 capsule  0     Tylenol  PM Extra Strength 25-500 mg tablet Generic drug: diphenhydrAMINE-acetaminophen   Take 1 tablet by mouth nightly as needed.   0         Stopped Medications    apixaban 5 mg (74 tabs) Dspk dose pack Commonly known as: ELIQUIS Replaced by: apixaban 2.5 mg Tab        Follow Up Appointments: Future Appointments  Date Time Provider Department Center  03/12/2024 10:45 AM [address] Rifaa Gannett, MD [facility] URO LEX [address]  03/26/2024  9:20 AM Breejante Primitivo Pouch, PA-C [facility] END CN None  03/26/2024 10:30 AM Therisa Mei-Ching Kuan-Celarier, MD [facility] GYN [facility] [place]  03/27/2024  4:00 PM Redell Lynwood Napoleon, MD [facility] URO GW [address]  05/08/2024  9:45 AM Scott Tanda Ee, MD [facility] CAR HP [address]    Condition on Discharge: Stable Discharge Disposition: Home with family  This visit is telemedicine service done on virtual platform with live audio and video communication Patient's identity was confirmed with confirmation of patient's name and room number Provider location: [place], [place] Provider is licensed in [place]  Patient's location is: [facility] [place], [place]  Length of time: 33 minutes   Valla JENEANE Sherry, MD

## 2024-03-11 NOTE — Progress Notes (Signed)
 Hematology/Oncology progress note  Patient Name:  Loretta Rangel Date of Birth:  03/28/1951 Date of Encounter:  02/26/2024  Referring Provider:  No ref. 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 Patient is here to discuss systemic treatment options.   Problem list: Patient Active Problem List  Diagnosis   Essential hypertension   Vitamin D deficiency   Uterine cancer    (CMD)   Endometrial cancer (CMD)   Cancer related pain   Other constipation   Iron deficiency   Dehydration   Acute on chronic anemia   Hydronephrosis of right kidney   Hypoxemia   Hypotension   Hypomagnesemia   Hyperkalemia   Ascites   History of pulmonary embolus (PE)    Past Oncologic Therapy Oncology History  Uterine cancer    (CMD)  12/01/2022 Initial Diagnosis   Uterine carcinoma (CMS/HCC) Uterine high grade serous carcinoma   01/10/2023 Surgery   At least Stage IICm uterine serous carcinoma (R SLN without nodal tissue) 25% myometrial invasion, cervical stromal involvement present, no LVSI, negative margins ER+ (1%), PR+ (30%), Her2Neu negative (IHC 1+), p53 mutated, pMMR  Patient declined adjuvant chemotherapy or radiation.   02/04/2023 -  Cancer Staged   Staging form: Corpus Uteri - Carcinoma and Carcinosarcoma, AJCC 8th Edition and FIGO 2023 - Pathologic: No stage assigned - Signed by Therisa Lanna Ades, MD on 02/04/2023    03/30/2023 Recurrence   Presented with pelvic pain and spotting. 04/10/2023 CTAP IMPRESSION: 1.  Postoperative changes of hysterectomy with an irregular soft tissue mass in the surgical bed abutting and possibly involving the rectum and sigmoid colon, bilateral external iliac extension of the mass, and multiple peritoneal, omental, mesenteric, and  retroperitoneal soft tissue nodules, concerning for metastatic disease. 2.  Soft tissue implants demonstrated along and likely involving the inferior aspect of the distal sigmoid colon. 3.  Mild left hydronephrosis to the level of the proximal ureter secondary to new left retroperitoneal soft tissue implants. 4.  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 No extension into the renal vein. 5.  Moderate volume rectal stool burden without evidence of obstruction.   10/23/2023 Progression   10/23/2023 CTCAP - new perihepatic lesions, increase in size of all intraperitoneal disease, enlargement of vaginal cuff nodule involving rectum  New Metastases?: Yes - perihepatic New Course of Treatment? Yes    01/12/2024 -  Supportive Treatment   Adult OP Blood Administration PRN x 30 DAYS Plan Provider: Sharma MARLA Bathe, MD   Endometrial cancer (CMD)  12/27/2022 Initial Diagnosis   Endometrial cancer (CMD)   02/09/2023 - 02/09/2023 Oncology Treatment   Protocol PACLitaxel / CARBOplatin Every 21 Days  Chemotherapy/Immunotherapy Medications CARBOplatin (PARAPLATIN) in sodium chloride 0.9 % 250 mL chemo IVPB, 440 mg, intravenous PACLitaxeL (TAXOL) 297 mg in sodium chloride (non-PVC) 0.9 % 299.5 mL chemo IVPB, 156 mg/m2 = 297 mg (89.1 % of original dose 175 mg/m2), intravenous Dose modification: 156 mg/m2 (original dose 175 mg/m2, Cycle 1, Reason: Patient Specific Dose)  16 - Other (patient decided to not do chemo)   05/10/2023 - 08/27/2023 Oncology Treatment   Protocol PACLitaxel / CARBOplatin Every 21 Days  Chemotherapy/Immunotherapy Medications CARBOplatin (PARAPLATIN) 410 mg in sodium chloride 0.9 % 291 mL chemo IVPB, 410 mg, intravenous Administration: 410 mg (05/10/2023), 500 mg (06/02/2023), 480 mg (06/23/2023), 520 mg (07/14/2023), 490  mg (08/04/2023), 450 mg (08/25/2023) PACLitaxeL (TAXOL) 336 mg in sodium chloride (non-PVC) 0.9 % 306 mL chemo IVPB, 175 mg/m2 = 336 mg, intravenous Administration: 336  mg (05/10/2023), 336 mg (06/02/2023), 336 mg (06/23/2023), 336 mg (07/14/2023), 336 mg (08/04/2023), 336 mg (08/25/2023)  02 - Therapy Complete   07/14/2023 - 10/06/2023 Supportive Treatment   Pembrolizumab Every 21 Days Plan Provider: Kalsoom K Khan, MD Treatment goal: Control Line of treatment: [No plan line of treatment]   10/23/2023 Progression   10/23/2023 CTCAP - new perihepatic lesions, increase in size of all intraperitoneal disease, enlargement of vaginal cuff nodule involving rectum  New Metastases?: Yes - perihepatic New Course of Treatment? Yes    11/07/2023 - 01/05/2024 Oncology Treatment   Protocol DOXOrubicin Every 28 Days  Chemotherapy/Immunotherapy Medications DOXOrubicin (ADRIAMYCIN) chemo injection 111 mg, 60 mg/m2 = 111 mg, intravenous Dose modification: 54 mg/m2 (original dose 60 mg/m2, Cycle 2) Administration: 111 mg (11/07/2023), 100 mg (12/04/2023), 100 mg (01/05/2024)  05 - Progression   01/12/2024 -  Supportive Treatment   Adult OP Blood Administration PRN x 30 DAYS Plan Provider: Kalsoom K Khan, MD   02/13/2024 -  Oncology Treatment   Protocol Gemcitabine / CISplatin Every 21 Days - NSCLC, Ovarian  Chemotherapy/Immunotherapy Medications gemcitabine (GEMZAR) 2,187 mg in sodium chloride 0.9 % 307.5 mL chemo IVPB, 1,250 mg/m2 = 2,188.8 mg, intravenous CISplatin (PLATINOL) 131 mg in sodium chloride 0.9 % chemo IVPB, 75 mg/m2 = 131 mg, intravenous  [Plan is still active]     History of Present Illness:  Loretta Rangel is a 73 y.o. female who is seen in consultation at the request of Kuan-Celarier, Therisa Falconer* for an evaluation of recurrent serous endometrial carcinoma.   Patient's oncologic history dates back to August 2024 when patient developed postmenopausal bleeding dating as far back as March 2024.  Patient had a pelvic ultrasound that showed thickened endometrial stripe.  She had biopsy performed on 11/29/2022 that showed atypical endometrial glands,  suspicious for carcinoma.  Patient was subsequently seen by gynecologic oncology.  She underwent robotic assisted total laparoscopic hysterectomy bilateral salpingo-oophorectomy and bilateral SNL and omental biopsy on 01/10/2023.  The pathology showed Final Diagnosis    A. LEFT EXTERNAL ILIAC SENTINEL LYMPH NODE, EXCISION:               One lymph node, negative for carcinoma (0/1)   B. RIGHT SENTINEL PELVIC LYMPH NODE, EXCISION:               Fibroadipose tissue with no significant pathologic findings              No lymph node identified   C. UTERUS, BILATERAL OVARIES AND FALLOPIAN TUBES, TOTAL HYSTERECTOMY AND BILATERAL SALPINGO-OOPHORECTOMY: Endometrial serous carcinoma, with 25% (5/20 mm) myometrial invasion, involving anterior cervical stroma Myometrium: Leiomyomas (up to 6 cm) Serosa: Endosalpingiosis, negative for carcinoma Bilateral ovaries: Cortical inclusion cysts Bilateral fallopian tubes no significant pathologic findings:   D. OMENTUM, BIOPSY:               Fibroadipose tissue with no significant pathologic findings   Tumor type/ specimen source:      Carcinoma/hysterectomy Paraffin block number:   C9        MISMATCH REPAIR PROTEIN EXPRESSION:  Protein:        Result: MLH-1           Intact PMS-2           Intact MSH-2  Intact MSH-6           Intact   INTERPRETATION: Endometrial carcinoma with normal mismatch repair protein expression. Prognostic Markers in Cancer   Block Number: C9   Results:   Estrogen Receptor:  Positive (1% weak)   Progesterone Receptor: Positive (30%, strong)   Her2: Negative (IHC score 1+)   Patient was recommended systemic chemotherapy with paclitaxel carboplatin and consents were done on 01/26/2023.  However based on medical record patient had significantly complicated postop course including vaginal bleeding which was concerning for possible recurrent disease.  Patient had CT of the chest abdomen pelvis performed 04/10/2023  that showed a irregular soft tissue mass in the surgical bed abutting and possibly involving the rectum, sigmoid colon, bilateral external iliac extension of the mass and multiple peritoneal and omental mesenteric and retroperitoneal soft tissue nodules concerning for metastatic disease.  Also noted was a soft tissue implant demonstrated along and likely involving the inferior aspect of the distal sigmoid colon.  Mild left hydronephrosis to the level of the proximal ureter secondary to new left retroperitoneal soft tissue implant.  Also noted was a thrombus in the left gonadal vein.  Patient also had a CT of the chest performed on May 04, 2022 which showed new peritoneal carcinomatosis but no metastatic disease in the chest.  Because of this patient was referred to us  for consideration of urgent systemic chemotherapy.   Patient does have complaints of having pain.  She is very weak tired and fatigued.  Treatment History: Taxol/Carboplatin every 21 days, 05/10/2023 - 08/25/2023 x 6 cycles  2.  CT chest abdomen pelvis on 10/23/2023: FINDINGS:   . Thoracic inlet: Within normal limits. . Chest wall/Axilla: Within normal limits. . Central airways: Patent. . Mediastinum/Hila: Small hiatal hernia. SABRA Heart: Right IJ Port-A-Cath tip terminates in the right atrium. Heart size within normal limits. No pericardial effusion. . Vessels: Aorta normal in caliber. No central pulmonary embolism. Pulmonary artery measures up to 3.5 cm as can be seen with pulmonary hypertension . Lungs/Pleura: No large airspace consolidation or pleural effusion.   . Liver: No suspicious focal findings. . Gallbladder/Biliary: Unremarkable. SABRA Spleen: Unremarkable. . Pancreas: Unremarkable. . Adrenals: Unremarkable. . Kidneys: Symmetric enhancement without hydronephrosis.   . Peritoneum/Mesenteries/Extraperitoneum: No pneumoperitoneum. Trace loculated free fluid in the pelvis. Interval increase in size most peritoneal deposits, a  few which are indexed below: -Left upper quadrant conglomerate deposit measuring up to 5.4 cm (series 2, image 81), previously 3.4 cm. -Right-sided peritoneal deposit measuring up to 2.1 cm (series 2, image 110), previously 0.9 cm. -Right lower quadrant conglomerate peritoneal deposit measuring up to 3.8 cm (series 2, image 147), previously 0.9 cm. -Multiple new perihepatic deposits, the largest of which measures 1.3 x 3.6 cm (series 2, image 101). -Deposits extend along the broad ligament into the bilateral paracolic gutters. -Enlarged mesorectal lymph node measuring up to 1.1 cm (series 2, image 146). Other lymph nodes in the abdomen and pelvis are difficult to distinguish from peritoneal deposits.   . Gastrointestinal tract: No evidence of obstruction. Colonic diverticulosis. Rectal disease involvement is discussed below.   . Ureters: Both ureters appear to traverse large peritoneal deposits without hydroureter. . Bladder: Possible tethering of the recurrent mass on the bladder dome (series 6, image 63). . Reproductive System: Progressively increasing lobular soft tissue in the resection bed now measuring 5.4 x 3.0 cm. Nodular extension of this recurrence appears to infiltrate the adjacent rectum at multiple locations without evidence of ongoing obstruction   .  Vascular: Aortobiiliac atherosclerotic calcifications without abdominal aortic aneurysm. Similar chronic focal dissection just above the iliac bifurcation . Musculoskeletal: No acute displaced fractures. Polyarticular degenerative changes. No aggressive focal bony lesions. Abdominal wall soft tissues unremarkable.   IMPRESSION: Findings of disease progression with increasingly widespread peritoneal disease as well as increased lobular soft tissue in the resection bed which may involve the rectum and possibly bladder dome.  3.  Initiation of chemotherapy with doxorubicin 60 mg/m on 11/07/2023 x 2 cycles.  Now with progression of  disease  4.  CT chest abdomen pelvis performed 01/08/2024: FINDINGS:   . Thoracic inlet: Within normal limits. . Chest wall/Axilla: Within normal limits. . Central airways: Patent. . Mediastinum/Hila: Unremarkable. SABRA Heart/Vessels: Heart size within normal limits. No pericardial effusion. Similar enlargement of the main pulmonary artery. Anemia, with low blood port density. . Lungs/Pleura: No large airspace consolidation or pleural effusion.   . Liver: No suspicious focal findings. . Gallbladder/Biliary: Unremarkable. SABRA Spleen: Unremarkable. . Pancreas: Unremarkable. . Adrenals: Unremarkable. . Kidneys: There is new moderate right-sided hydronephrosis with hydroureter. Transition is within abnormal soft tissue in the right pelvic sidewall.  There is additionally of left-sided extrarenal pelvis, however there is no obvious left-sided hydronephrosis.   . Peritoneum/Mesenteries/Extraperitoneum: No free air. New small volume ascites. Extensive peritoneal nodularity which is progressed from prior. For example, subcapsular hepatic implant on series 2 image 105 measures 4.8 x 1.6 cm, previously 3.6 x 1.3 cm. Right pelvic sidewall implant measures 5.0 x 3.1 cm, previously 4.0 x 2.4 cm. Extensive soft tissue involving the omentum. Enlargement is a rectal node on series 2 image 147 is increased, measuring 1.6 cm, previously 1.1 cm. . Gastrointestinal tract: No evidence of obstruction. Assessment of bowel loops is difficult given lack of contrast and extensive peritoneal deposits.   . Ureters: Right-sided hydroureter as above. Possible focal dilation of the left ureter (series 2 image 123).  . Bladder: Decompressed. . Reproductive System: Redemonstrated soft tissue in the resection bed, which may involve the rectum. This is difficult to accurately measure given absence of IV contrast, however this appears enlarged from prior.   . Vascular: Mild ectasia of the distal abdominal aorta. . Musculoskeletal:  No acute displaced fractures. Polyarticular degenerative changes. No aggressive focal bony lesions. Abdominal wall soft tissues unremarkable.   IMPRESSION: 1.  Findings concerning for disease progression, with new moderate right-sided hydronephrosis and hydroureter, with transition point in the right pelvic sidewall implant. 2.  While there is no definite left-sided hydronephrosis, there is a left extrarenal pelvis and possible focal dilation of the left ureter. 3.  New small volume ascites. 4.  Increased size of peritoneal implants. 5.  Other findings as above.  5.  Initiation of palliative chemotherapy with gemcitabine cisplatin day 1 day 8 on a 21-day cycle.   Current Therapy: Gemcitabine/cisplatin (to be held secondary to severe anemia)  Interim Note: Loretta Rangel returns today for follow up of recurrent endometrial carcinoma.  Clinically stable.  No new complaints.  No shortness of breath or chest pains.  No palpitations.  No recent hospitalizations since her last visit.  No bleeding or bruising reported.   Allergies: Patient has no known allergies.  Medications: Current Outpatient Medications  Medication Sig Dispense Refill   acetaminophen  (TYLENOL ) 500 mg tablet Take 2 tablets (1,000 mg total) by mouth every 6 (six) hours.     apixaban (ELIQUIS) 2.5 mg tab Take 1 tablet (2.5 mg total) by mouth 2 (two) times a day. 60 tablet 0  BisaCODYL (DULCOLAX) 5 mg EC tablet Take 5 mg by mouth daily as needed for constipation.     diphenhydrAMINE-acetaminophen  (Tylenol  PM Extra Strength) 25-500 mg tablet Take 1 tablet by mouth nightly as needed.     ferrous gluconate 324 mg (38 mg iron) tablet Take 1 tablet (324 mg total) by mouth daily. (Patient not taking: Reported on 02/21/2024) 90 tablet 0   loperamide (IMODIUM) 2 mg capsule Take 2 mg by mouth as needed in the morning and 2 mg as needed at noon and 2 mg as needed in the evening and 2 mg as needed before bedtime for  diarrhea.     magnesium oxide 400 mg (241 mg magnesium) tab Take 1 tablet (400 mg total) by mouth daily. 30 tablet 1   ondansetron (ZOFRAN-ODT) 8 mg disintegrating tablet Dissolve 1 tablet (8 mg total) by mouth every 8 hours as needed (chemo induced nausea/vomiting) 30 tablet 3   oxyCODONE (ROXICODONE) 5 mg immediate release tablet Take 1-2 tablets (5-10 mg total) by mouth every 4 (four) hours as needed for moderate pain (4-6). 90 tablet 0   prochlorperazine (COMPAZINE) 10 mg tablet Take 1 tablet (10 mg total) by mouth every 6 (six) hours as needed (chemo induced nausea/vomiting) 30 tablet 3   tamsulosin (FLOMAX) 0.4 mg cap TAKE 1 CAPSULE BY MOUTH EVERY DAY 30 capsule 0   No current facility-administered medications for this visit.    Past Medical History: Past Medical History:  Diagnosis Date   Hypertension    Seasonal allergies    Uterine carcinoma    (CMD)     Past Surgical History: Past Surgical History:  Procedure Laterality Date   CARDIAC CATHETERIZATION Right 01/29/2024   CV CATHETERIZATION RIGHT HEART performed by Glendia Tanda Ee, MD at [facility] INVASIVE LAB   CESAREAN SECTION     FINGER SURGERY Left    pinky   INVASIVE VASCULAR PROCEDURE N/A 01/29/2024   CV VENOGRAM INFERIOR VENA CAVA performed by Glendia Tanda Ee, MD at [facility] INVASIVE LAB   INVASIVE VASCULAR PROCEDURE Bilateral 01/29/2024   Pulmonary angiogram performed by Glendia Tanda Ee, MD at [facility] INVASIVE LAB   ROBOTIC ASSISTED HYSTERECTOMY N/A 01/10/2023   ROBOTIC ASSISTED TOTAL LAPAROSCOPIC HYSTERECTOMY, BSO, BILATERAL SLN, OMENTAL BIOPSY performed by Therisa Lanna Ades, MD at [facility] OR   URETERAL STENT PLACEMENT N/A 01/18/2024   RIGHT CYSTOSCOPY WITH STENT INSERTION/EXCHANGE WITH OR WITHOUT RETROGRADE PYELOGRAM performed by Amr Sherif Mohamed Rifaa Margart Sierras, MD at [facility] OR     Personal and Social History: Social History   Socioeconomic History   Marital status: Married  Tobacco Use    Smoking status: Never   Smokeless tobacco: Never  Substance and Sexual Activity   Alcohol use: Not Currently   Drug use: Never   Sexual activity: Never   Social Drivers of Health   Food Insecurity: Low Risk  (02/13/2024)   Food vital sign    Within the past 12 months, you worried that your food would run out before you got money to buy more: Never true    Within the past 12 months, the food you bought just didn't last and you didn't have money to get more: Never true  Transportation Needs: No Transportation Needs (02/13/2024)   Transportation    In the past 12 months, has lack of reliable transportation kept you from medical appointments, meetings, work or from getting things needed for daily living? : No  Safety: Low Risk  (02/13/2024)   Safety  How often does anyone, including family and friends, physically hurt you?: Never    How often does anyone, including family and friends, insult or talk down to you?: Never    How often does anyone, including family and friends, threaten you with harm?: Never    How often does anyone, including family and friends, scream or curse at you?: Never  Living Situation: Low Risk  (02/13/2024)   Living Situation    What is your living situation today?: I have a steady place to live    Think about the place you live. Do you have problems with any of the following? Choose all that apply:: None/None on this list    Family History: Cancer-related family history is negative for Breast cancer, Ovarian cancer, and Uterine cancer. She indicated that the status of her mother is unknown. She indicated that the status of her neg hx is unknown.   Results for orders placed or performed in visit on 02/26/24  Comprehensive Metabolic Panel   Collection Time: 02/26/24  9:35 AM  Result Value Ref Range   Sodium 136 136 - 145 mmol/L   Potassium 5.5 (H) 3.4 - 4.5 mmol/L   Chloride 106 98 - 107 mmol/L   CO2 25 21 - 31 mmol/L   Anion Gap 5 (L) 6 - 14  mmol/L   Glucose, Random 96 70 - 99 mg/dL   Blood Urea Nitrogen (BUN) 28 (H) 7 - 25 mg/dL   Creatinine 8.96 9.39 - 1.20 mg/dL   eGFR 58 (L) >40 fO/fpw/8.26f7   Albumin 2.6 (L) 3.5 - 5.7 g/dL   Total Protein 6.1 (L) 6.4 - 8.9 g/dL   Bilirubin, Total 0.4 0.3 - 1.0 mg/dL   Alkaline Phosphatase (ALP) 198 (H) 34 - 104 U/L   Aspartate Aminotransferase (AST) 35 13 - 39 U/L   Alanine Aminotransferase (ALT) 12 7 - 52 U/L   Calcium 8.0 (L) 8.6 - 10.3 mg/dL   BUN/Creatinine Ratio     Corrected Calcium 9.1 mg/dL  Cancer Antigen (CA) 874   Collection Time: 02/26/24  9:35 AM  Result Value Ref Range   Cancer Antigen (CA) 125 310 (H) <35 U/mL  Blood Bank Specimen Hold   Collection Time: 02/26/24  9:35 AM  Result Value Ref Range   AH Hold Specimen for Add-Ons Auto Resulted.   CBC with Differential   Collection Time: 02/26/24  9:35 AM  Result Value Ref Range   WBC 9.63 4.40 - 11.00 10*3/uL   RBC 2.82 (L) 4.10 - 5.10 10*6/uL   Hemoglobin 8.1 (L) 12.3 - 15.3 g/dL   Hematocrit 75.6 (L) 64.0 - 44.6 %   Mean Corpuscular Volume (MCV) 86.1 80.0 - 96.0 fL   Mean Corpuscular Hemoglobin (MCH) 28.8 27.5 - 33.2 pg   Mean Corpuscular Hemoglobin Conc (MCHC) 33.5 33.0 - 37.0 g/dL   Red Cell Distribution Width (RDW) 17.5 (H) 12.3 - 17.0 %   Platelet Count (PLT) 343 150 - 450 10*3/uL   Mean Platelet Volume (MPV) 7.0 6.8 - 10.2 fL   Neutrophils % 81 %   Lymphocytes % 8 %   Monocytes % 11 %   Eosinophils % 0 %   Basophils % 1 %   Neutrophils Absolute 7.80 1.80 - 7.80 10*3/uL   Lymphocytes # 0.80 (L) 1.00 - 4.80 10*3/uL   Monocytes # 1.00 (H) 0.00 - 0.80 10*3/uL   Eosinophils # 0.00 0.00 - 0.50 10*3/uL   Basophils # 0.10 0.00 - 0.20 10*3/uL   US   Abdomen Limited Result Date: 03/04/2024 US  ABDOMEN LIMITED, 03/04/2024 1:02 PM INDICATION: Ascites previously requiring paracentesis. Limited ultrasound of the abdomen performed prior to possible paracentesis. COMPARISON: Paracentesis performed February 16, 2024  FINDINGS: Limited ultrasound done of all 4 quadrants. There is only trace volume of ascites present. There is no pocket of fluid large enough to allow for safe approach for paracentesis.   Trace volume ascites without pocket of fluid large enough to allow for safe approach for paracentesis. Ultrasound performed by: Sari Lamp, PA-C  US  Guided Abdominal Paracentesis Result Date: 02/16/2024 INDICATION: 73 year old female with endometrial cancer and recurrent ascites. Request for therapeutic paracentesis.  EXAM:  ULTRASOUND GUIDED  PARACENTESIS  MEDICATIONS:  1% Lidocaine 8 mL  COMPLICATION: None  PROCEDURE:  Informed written consent was obtained from the patient after a discussion of the risks, benefits and alternatives to treatment. A timeout was performed prior to the initiation of the procedure.  Initial ultrasound scanning demonstrates a small amount of ascites within the left lower abdominal quadrant. The left lower abdomen was prepped and draped in the usual sterile fashion. 1% lidocaine was used for local anesthesia.  Following this, a 7 cm Yueh catheter was introduced. An ultrasound image was saved for documentation purposes. The paracentesis was performed. The catheter was removed and a dressing was applied. The patient tolerated the procedure well without immediate post procedural complication.  FINDINGS:  A total of approximately 1.4 L of red-colored fluid was removed.     Successful ultrasound-guided paracentesis yielding 1.4 L of peritoneal fluid.  Procedure was performed by Toya Cousin, PA-C under the supervision of  Gordy Roulette M.D.   US  Renal Bilateral Complete Result Date: 02/16/2024 US  RENAL BILATERAL COMPLETE, 02/15/2024 4:09 PM INDICATION: Follow-up of bilateral hydronephrosis post paracentesis. COMPARISON: CT abdomen 02/13/2024. TECHNIQUE: Multi-planar, real-time ultrasonography of the retroperitoneum and urinary tract using grayscale imaging was performed, supplemented by color and/or power  Doppler as needed. FINDINGS: .  Right Kidney: Length = 9.1 cm. Normal contour and echogenicity. Renal stent within the pelvis. No hydronephrosis or perinephric fluid. No focal mass is identified. .  Left Kidney: Length = 11.7 cm. Normal contour and echogenicity. Mild hydronephrosis. The renal pelvis measures up to 1.9 cm may be slightly accentuated due to extrarenal pelvis similar to prior CT. No focal mass is identified. .  Bladder: Normal. .  Additional comments: Intraperitoneal implants along the peritoneal reflection at the are partially visualized and better appreciated on prior CT.   Interval development of mild left hydronephrosis. The renal pelvis is dilated measuring up to 1.9 cm, slightly accentuated due to extrarenal pelvis. Intraperitoneal tumoral implants are better appreciated on prior CT.   US  Guided Abdominal Paracentesis Result Date: 02/14/2024 INDICATION: 73 year old female with endometrial carcinoma metastatic to the abdomen and ascites for diagnostic and therapeutic paracentesis.  EXAM:  ULTRASOUND GUIDED  PARACENTESIS   MEDICATIONS:  1% Lidocaine 10 mL  COMPLICATIONS:  None immediate  PROCEDURE:  Informed written consent was obtained from the patient after a discussion of the risks, benefits and alternatives to treatment. A timeout was performed prior to the initiation of the procedure.  Initial ultrasound scanning demonstrates a moderate amount of ascites within the left lower abdominal quadrant. The left lower abdomen was prepped and draped in the usual sterile fashion. 1% lidocaine was used for local anesthesia.  Following this, a Safe T Centesis catheter was introduced. An ultrasound image was saved for documentation purposes. The paracentesis was performed. The catheter was removed and a dressing  was applied. The patient tolerated the procedure well without immediate post procedural complication.  FINDINGS:  A total of approximately 3.2 L of dark, red, thin peritoneal fluid was  removed.     Successful ultrasound-guided paracentesis yielding 3.2 L of peritoneal fluid.  Procedure performed by Abigail C. [place], PA-C   ECG 12 lead Result Date: 02/14/2024 Ventricular Rate                   105       BPM                 Atrial Rate                        105       BPM                 P-R Interval                       140       ms                  QRS Duration                       76        ms                  Q-T Interval                       360       ms                  QTC Calculation Bazett             475       ms                  Calculated P Axis                  49        degrees             Calculated R Axis                  -9        degrees             Calculated T Axis                  45        degrees             Sinus tachycardia Otherwise normal ECG When compared with ECG of 13-Feb-2024 11 55, No significant change was found Confirmed by Rojelio Dunnings  [phone]  on 02-14-2024 7 50 36 AM  US  Renal Bilateral Complete Result Date: 02/13/2024 US  RENAL BILATERAL COMPLETE, 02/13/2024 4:40 PM INDICATION: hyperK and AKI COMPARISON: CT from 02/13/2024 TECHNIQUE: Multi-planar, real-time ultrasonography of the retroperitoneum and urinary tract using grayscale imaging was performed, supplemented by color and/or power Doppler as needed. FINDINGS: .  Right Kidney: Length = 8.3 cm. Normal contour and echogenicity. Mild hydronephrosis No focal mass is identified. .  Left Kidney: Length = 10.5 cm. Normal contour and echogenicity. Moderate hydronephrosis No focal mass is identified. .  Bladder: Normal. .  Additional comments: Moderate ascites   Bilateral hydronephrosis left greater than right. Moderate ascites.  CT Abdomen Pelvis WO Contrast Result Date: 02/13/2024 CT ABDOMEN PELVIS WO CONTRAST, 02/13/2024 1:55 PM INDICATION: Abdominal pain, acute, nonlocalized ADDITIONAL HISTORY: None. COMPARISON: 01/08/2024 TECHNIQUE: CT images of the abdomen and pelvis were obtained without the use  of intravenous contrast. Conventional axial reconstructions and multiplanar reformatted images were submitted for review. LIMITATIONS: Evaluation of various soft tissue structures as well as the vasculature is limited without the availability of intravenous contrast. Parameters utilized in some study protocols to mitigate patient radiation exposure can also reduce sensitivity and specificity of assessment. FINDINGS:  .  MSK:       Within normal limits for age. No aggressive osseous, or suspicious soft tissue lesion. SABRA  VASCULAR:     Moderate atherosclerotic calcifications. Multiple pelvic phleboliths, including periureteral phlebolith. INCLUDED THORAX: Small hiatal hernia. 6 mm left cardiophrenic node (series 2 image 8). Central line tip in the right atrium.  Mild basal atelectasis. ABDOMEN and PELVIS: .  Liver, Spleen:     Within normal limits. Limited without contrast, but no definite focal hepatic or splenic lesions seen. .  Pancreas, Gallbladder/biliary:   Cholelithiasis. No biliary or pancreatic duct dilation seen. No pancreatic mass or peripancreatic edema. .  Adrenals:     Within normal limits. .  Kidneys, Ureters:  Prominent left extrarenal pelvis. Right double-J stent from the renal pelvis to the bladder. No urolithiasis seen. .  GI tract, Peritoneum, Mesentery and Extraperitoneum: Increased moderate volume ascites. Probably similar multiple intraperitoneal masses. Fluid-filled bowel loops without pathologic dilation. .  Bladder:       Decompressed. No bladder mass, intravesical calculus or perivesical stranding seen. .  Reproductive:     Surgical bed mass inseparable from vaginal cuff, bladder, and the rectosigmoid colon.   CONCLUSION: 1. Probable similar intraperitoneal tumor implants. 2. Increased ascites. 3. 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   ECG 12 lead Result Date: 02/13/2024 Ventricular Rate                   96        BPM                 Atrial Rate                         96        BPM                 P-R Interval                       146       ms                  QRS Duration                       76        ms                  Q-T Interval                       368       ms                  QTC Calculation Bazett             464       ms  Calculated P Axis                  42        degrees             Calculated R Axis                  -6        degrees             Calculated T Axis                  37        degrees             Sinus rhythm Normal ECG When compared with ECG of 26-Jan-2024 12 42, Incomplete right bundle branch block is no longer present Confirmed by Rojelio Dunnings  [phone]  on 02-13-2024 12 53 39 PM   Physical Examination: Vital Signs: BP 108/68   Pulse (!) 117   Temp 97.7 F (36.5 C)   Resp 16   Wt 63 kg (139 lb)   SpO2 100%   BMI 25.42 kg/m  General: Pleasant female in mild distress secondary to pain, ECOG 1 HEENT: Normocephalic, atraumatic. PERRLA, EOMI, sclera anicteric.  Nose without discharge.  Mouth and lips showed no lesions; oral mucosa is moist.  Neck supple without masses. Lymphatic/Immunologic:  No cervical, axillary, or femoral adenopathy.  Cardiovascular:  RRR without significant murmurs, gallops, or rubs.  Heart not clinically enlarged.  No lower extremity edema.   Respiratory:  Chest clear to percussion and auscultation bilaterally; no respiratory distress. Patient speaks in complete sentences. Gastrointestinal:  Abdomen soft and without tenderness; no hepatosplenomegaly or other masses. No clinical ascites. Musculoskeletal:  No bony pain or tenderness. No tenosynovitis or joint effusions noted. Extremities:  No edema or suspicious rashes.  No cyanosis or clubbing. Skin:  No pathologic appearing petechiae or bruising noted. Neurologic: Alert and oriented to person, place, time and circumstance.  Strength and sensation are grossly intact.  Cranial nerves III through XII grossly intact.     Assessment and plan:73 y.o. female with   1.  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 Therisa Mei-Ching Kuan-Celarier for consideration of systemic chemotherapy with paclitaxel and carboplatin for recurrent endometrial carcinoma.   S/P cycle 6 of Taxol carboplatin for recurrent endometrial carcinoma she completed the chemotherapy on 08/25/2023.  With this she did receive pembrolizumab as well.  Patient had restaging scans performed on 10/23/2023 which unfortunately reveals disease progression with increasingly widespread peritoneal disease as well as increased lobular soft tissue in the resection bed which may involve the rectum and possibly bladder dome.  Patient was seen by Dr. Willena who has now recommended retreatment.  Multiple options were discussed with the patient and I have reviewed her notes.  Patient has opted to proceed with doxorubicin given every 28 days.  She has had her echocardiogram performed.  She also had chemo education.  She will receive cycle 1 of doxorubicin day 1 on 11/07/2023, doses 60 mg/m.  She is instructed how to take her antinausea medicines and to push fluids. In spite of dose reducing doxorubicin patient still continues to struggle with that.  She has severe nausea and vomiting.  She was initiated on Zyprexa but they did not fill the prescription because it is prescribed for schizophrenic's.  I have reassured them that we do use it in the oncology patients as well especially when they  have intractable nausea.  They are going to have this filled.  Also concerning is that she is on fentanyl patch and this could potentially also be causing her to be nauseous and vomiting.  She will take the patch off.  Patient had CT of the chest abdomen pelvis performed on 01/04/2024 which showed findings concerning for disease progression, with new moderate right-sided hydronephrosis and hydroureter with transition point in the right pelvic sidewall implant.  No  definite left-sided hydronephrosis.  There was a left extrarenal pelvis and possible focal dilation of the left ureter.  New small volume ascites, increased size of peritoneal implants.  Based on this patient has been seen by Dr. Thornton who has recommended proceeding with a ureteral stent placement.  Also to initiate systemic chemotherapy with gemcitabine and cisplatin given day 1 day 8 on a 21-day cycle.  Patient is not a good candidate for chemotherapy. The risks of treatment far out way the benefits. No chemotherapy recommended. We continue supportive therapy with blood transfusions, platelet transfusions if needed, and paracentesis and thoracentesis if needed. Continue pain management.  Patient is continued on supportive care.  Any kind of systemic therapy is on hold for now due to patient's overall poor performance status.  We did do labs which show hemoglobin 8.1 hematocrit is 24.3 white count platelets are normal.  Her kidney function is improved with creatinine 1.03.  Potassium is still elevated at 5.5.  Alk phos 198 but transaminases are normal.  White count and platelets remain normal.  Today she does not require a blood transfusion as she is asymptomatic.  However I do think that she will periodically require transfusion support.  I will plan on having her seen on a weekly basis for labs.  If her hemoglobin goes below 8 she will receive transfusion.  Family understands and agrees with these recommendations.    2. We discussed that side effects of chemotherapy could include, but would not be limited to, hair loss, fatigue, nausea and vomiting, diarrhea or constipation, myelosuppression with risk of neutropenic fever, altered taste sensation, mouth sores, neuropathy, infusion reactions, nail changes, arthralgias, and disturbances in cognitive function.  We discussed the need for antiemetics, possible growth factor support, IV access, and intensive monitoring for toxicity. Informed consent was  verbally obtained, and chemotherapy teaching will be scheduled.  3.  Pain management:  She will continue oxycodone for breakthrough pain.  Patient may be developing nausea and vomiting from fentanyl patch we will discontinue it.  4.  Antiemetics: Patient does have Zofran and Compazine and she has been instructed how to take these.  Patient is encouraged to take Zyprexa   A total of 30 minutes was spent in patient care today.  Greater than 50% of my time was spent face-to-face and nonface-to-face with the patient, reviewing her medical records, completing a physical examination, going over the results of the radiology reports, laboratory results, treatment of endometrial carcinoma, completing treatment plan and answering the patient and family's questions. Patient is encouraged to call with any questions or concerns if they should arise prior to next scheduled office visit.   This record has been created using Conservation officer, historic buildings.

## 2024-03-25 NOTE — Progress Notes (Signed)
 Hematology/Oncology progress note  Patient Name:  Loretta Rangel Date of Birth:  08-23-1950 Date of Encounter:  03/25/2024  Referring Provider:  No ref. 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   Problem list: Patient Active Problem List  Diagnosis   Essential hypertension   Vitamin D deficiency   Uterine cancer    (CMD)   Endometrial cancer (CMD)   Cancer related pain   Other constipation   Iron deficiency   Dehydration   Acute on chronic anemia   Hydronephrosis of right kidney   Hypoxemia   Hypotension   Hypomagnesemia   Hyperkalemia   Ascites   History of pulmonary embolus (PE)    Past Oncologic Therapy Oncology History  Uterine cancer    (CMD)  12/01/2022 Initial Diagnosis   Uterine carcinoma (CMS/HCC) Uterine high grade serous carcinoma   01/10/2023 Surgery   At least Stage IICm uterine serous carcinoma (R SLN without nodal tissue) 25% myometrial invasion, cervical stromal involvement present, no LVSI, negative margins ER+ (1%), PR+ (30%), Her2Neu negative (IHC 1+), p53 mutated, pMMR  Patient declined adjuvant chemotherapy or radiation.   02/04/2023 -  Cancer Staged   Staging form: Corpus Uteri - Carcinoma and Carcinosarcoma, AJCC 8th Edition and FIGO 2023 - Pathologic: No stage assigned - Signed by Therisa Lanna Ades, MD on 02/04/2023    03/30/2023 Recurrence   Presented with pelvic pain and spotting. 04/10/2023 CTAP IMPRESSION: 1.  Postoperative changes of hysterectomy with an irregular soft tissue mass in the surgical bed abutting and possibly involving the rectum and sigmoid colon, bilateral external iliac extension of the mass, and multiple peritoneal, omental, mesenteric, and retroperitoneal soft tissue nodules, concerning for  metastatic disease. 2.  Soft tissue implants demonstrated along and likely involving the inferior aspect of the distal sigmoid colon. 3.  Mild left hydronephrosis to the level of the proximal ureter secondary to new left retroperitoneal soft tissue implants. 4.  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 No extension into the renal vein. 5.  Moderate volume rectal stool burden without evidence of obstruction.   10/23/2023 Progression   10/23/2023 CTCAP - new perihepatic lesions, increase in size of all intraperitoneal disease, enlargement of vaginal cuff nodule involving rectum  New Metastases?: Yes - perihepatic New Course of Treatment? Yes    01/12/2024 -  Supportive Treatment   Adult OP Blood Administration PRN x 30 DAYS Plan Provider: Sharma MARLA Bathe, MD   Endometrial cancer (CMD)  12/27/2022 Initial Diagnosis   Endometrial cancer (CMD)   02/09/2023 - 02/09/2023 Oncology Treatment   Protocol PACLitaxel / CARBOplatin Every 21 Days  Chemotherapy/Immunotherapy Medications CARBOplatin (PARAPLATIN) in sodium chloride 0.9 % 250 mL chemo IVPB, 440 mg, intravenous PACLitaxeL (TAXOL) 297 mg in sodium chloride (non-PVC) 0.9 % 299.5 mL chemo IVPB, 156 mg/m2 = 297 mg (89.1 % of original dose 175 mg/m2), intravenous Dose modification: 156 mg/m2 (original dose 175 mg/m2, Cycle 1, Reason: Patient Specific Dose)  16 - Other (patient decided to not do chemo)   05/10/2023 - 08/27/2023 Oncology Treatment   Protocol PACLitaxel / CARBOplatin Every 21 Days  Chemotherapy/Immunotherapy Medications CARBOplatin (PARAPLATIN) 410 mg in sodium chloride 0.9 % 291 mL chemo IVPB, 410 mg, intravenous Administration: 410 mg (05/10/2023), 500 mg (06/02/2023), 480 mg (06/23/2023), 520 mg (07/14/2023), 490 mg (08/04/2023), 450 mg (08/25/2023) PACLitaxeL (TAXOL)  336 mg in sodium chloride (non-PVC) 0.9 % 306 mL chemo IVPB, 175 mg/m2 = 336 mg, intravenous Administration: 336 mg (05/10/2023), 336 mg (06/02/2023), 336 mg  (06/23/2023), 336 mg (07/14/2023), 336 mg (08/04/2023), 336 mg (08/25/2023)  02 - Therapy Complete   07/14/2023 - 10/06/2023 Supportive Treatment   Pembrolizumab Every 21 Days Plan Provider: Kalsoom K Khan, MD Treatment goal: Control Line of treatment: [No plan line of treatment]   10/23/2023 Progression   10/23/2023 CTCAP - new perihepatic lesions, increase in size of all intraperitoneal disease, enlargement of vaginal cuff nodule involving rectum  New Metastases?: Yes - perihepatic New Course of Treatment? Yes    11/07/2023 - 01/05/2024 Oncology Treatment   Protocol DOXOrubicin Every 28 Days  Chemotherapy/Immunotherapy Medications DOXOrubicin (ADRIAMYCIN) chemo injection 111 mg, 60 mg/m2 = 111 mg, intravenous Dose modification: 54 mg/m2 (original dose 60 mg/m2, Cycle 2) Administration: 111 mg (11/07/2023), 100 mg (12/04/2023), 100 mg (01/05/2024)  05 - Progression   01/12/2024 -  Supportive Treatment   Adult OP Blood Administration PRN x 30 DAYS Plan Provider: Kalsoom K Khan, MD   02/13/2024 -  Oncology Treatment   Protocol Gemcitabine / CISplatin Every 21 Days - NSCLC, Ovarian  Chemotherapy/Immunotherapy Medications gemcitabine (GEMZAR) 2,187 mg in sodium chloride 0.9 % 307.5 mL chemo IVPB, 1,250 mg/m2 = 2,188.8 mg, intravenous CISplatin (PLATINOL) 131 mg in sodium chloride 0.9 % chemo IVPB, 75 mg/m2 = 131 mg, intravenous  [Plan is still active]     History of Present Illness:  Loretta Rangel is a 73 y.o. female who is seen in consultation at the request of Kuan-Celarier, Therisa Falconer* for an evaluation of recurrent serous endometrial carcinoma.   Patient's oncologic history dates back to August 2024 when patient developed postmenopausal bleeding dating as far back as March 2024.  Patient had a pelvic ultrasound that showed thickened endometrial stripe.  She had biopsy performed on 11/29/2022 that showed atypical endometrial glands, suspicious for carcinoma.  Patient was subsequently  seen by gynecologic oncology.  She underwent robotic assisted total laparoscopic hysterectomy bilateral salpingo-oophorectomy and bilateral SNL and omental biopsy on 01/10/2023.  The pathology showed Final Diagnosis    A. LEFT EXTERNAL ILIAC SENTINEL LYMPH NODE, EXCISION:               One lymph node, negative for carcinoma (0/1)   B. RIGHT SENTINEL PELVIC LYMPH NODE, EXCISION:               Fibroadipose tissue with no significant pathologic findings              No lymph node identified   C. UTERUS, BILATERAL OVARIES AND FALLOPIAN TUBES, TOTAL HYSTERECTOMY AND BILATERAL SALPINGO-OOPHORECTOMY: Endometrial serous carcinoma, with 25% (5/20 mm) myometrial invasion, involving anterior cervical stroma Myometrium: Leiomyomas (up to 6 cm) Serosa: Endosalpingiosis, negative for carcinoma Bilateral ovaries: Cortical inclusion cysts Bilateral fallopian tubes no significant pathologic findings:   D. OMENTUM, BIOPSY:               Fibroadipose tissue with no significant pathologic findings   Tumor type/ specimen source:      Carcinoma/hysterectomy Paraffin block number:   C9        MISMATCH REPAIR PROTEIN EXPRESSION:  Protein:        Result: MLH-1           Intact PMS-2           Intact MSH-2           Intact MSH-6  Intact   INTERPRETATION: Endometrial carcinoma with normal mismatch repair protein expression. Prognostic Markers in Cancer   Block Number: C9   Results:   Estrogen Receptor:  Positive (1% weak)   Progesterone Receptor: Positive (30%, strong)   Her2: Negative (IHC score 1+)   Patient was recommended systemic chemotherapy with paclitaxel carboplatin and consents were done on 01/26/2023.  However based on medical record patient had significantly complicated postop course including vaginal bleeding which was concerning for possible recurrent disease.  Patient had CT of the chest abdomen pelvis performed 04/10/2023 that showed a irregular soft tissue mass in the  surgical bed abutting and possibly involving the rectum, sigmoid colon, bilateral external iliac extension of the mass and multiple peritoneal and omental mesenteric and retroperitoneal soft tissue nodules concerning for metastatic disease.  Also noted was a soft tissue implant demonstrated along and likely involving the inferior aspect of the distal sigmoid colon.  Mild left hydronephrosis to the level of the proximal ureter secondary to new left retroperitoneal soft tissue implant.  Also noted was a thrombus in the left gonadal vein.  Patient also had a CT of the chest performed on May 04, 2022 which showed new peritoneal carcinomatosis but no metastatic disease in the chest.  Because of this patient was referred to us  for consideration of urgent systemic chemotherapy.   Patient does have complaints of having pain.  She is very weak tired and fatigued.  Treatment History: Taxol/Carboplatin every 21 days, 05/10/2023 - 08/25/2023 x 6 cycles  2.  CT chest abdomen pelvis on 10/23/2023: FINDINGS:   . Thoracic inlet: Within normal limits. . Chest wall/Axilla: Within normal limits. . Central airways: Patent. . Mediastinum/Hila: Small hiatal hernia. SABRA Heart: Right IJ Port-A-Cath tip terminates in the right atrium. Heart size within normal limits. No pericardial effusion. . Vessels: Aorta normal in caliber. No central pulmonary embolism. Pulmonary artery measures up to 3.5 cm as can be seen with pulmonary hypertension . Lungs/Pleura: No large airspace consolidation or pleural effusion.   . Liver: No suspicious focal findings. . Gallbladder/Biliary: Unremarkable. SABRA Spleen: Unremarkable. . Pancreas: Unremarkable. . Adrenals: Unremarkable. . Kidneys: Symmetric enhancement without hydronephrosis.   . Peritoneum/Mesenteries/Extraperitoneum: No pneumoperitoneum. Trace loculated free fluid in the pelvis. Interval increase in size most peritoneal deposits, a few which are indexed below: -Left upper  quadrant conglomerate deposit measuring up to 5.4 cm (series 2, image 81), previously 3.4 cm. -Right-sided peritoneal deposit measuring up to 2.1 cm (series 2, image 110), previously 0.9 cm. -Right lower quadrant conglomerate peritoneal deposit measuring up to 3.8 cm (series 2, image 147), previously 0.9 cm. -Multiple new perihepatic deposits, the largest of which measures 1.3 x 3.6 cm (series 2, image 101). -Deposits extend along the broad ligament into the bilateral paracolic gutters. -Enlarged mesorectal lymph node measuring up to 1.1 cm (series 2, image 146). Other lymph nodes in the abdomen and pelvis are difficult to distinguish from peritoneal deposits.   . Gastrointestinal tract: No evidence of obstruction. Colonic diverticulosis. Rectal disease involvement is discussed below.   . Ureters: Both ureters appear to traverse large peritoneal deposits without hydroureter. . Bladder: Possible tethering of the recurrent mass on the bladder dome (series 6, image 63). . Reproductive System: Progressively increasing lobular soft tissue in the resection bed now measuring 5.4 x 3.0 cm. Nodular extension of this recurrence appears to infiltrate the adjacent rectum at multiple locations without evidence of ongoing obstruction   . Vascular: Aortobiiliac atherosclerotic calcifications without abdominal aortic aneurysm. Similar chronic  focal dissection just above the iliac bifurcation . Musculoskeletal: No acute displaced fractures. Polyarticular degenerative changes. No aggressive focal bony lesions. Abdominal wall soft tissues unremarkable.   IMPRESSION: Findings of disease progression with increasingly widespread peritoneal disease as well as increased lobular soft tissue in the resection bed which may involve the rectum and possibly bladder dome.  3.  Initiation of chemotherapy with doxorubicin 60 mg/m on 11/07/2023 x 2 cycles.  Now with progression of disease  4.  CT chest abdomen pelvis performed  01/08/2024: FINDINGS:   . Thoracic inlet: Within normal limits. . Chest wall/Axilla: Within normal limits. . Central airways: Patent. . Mediastinum/Hila: Unremarkable. SABRA Heart/Vessels: Heart size within normal limits. No pericardial effusion. Similar enlargement of the main pulmonary artery. Anemia, with low blood port density. . Lungs/Pleura: No large airspace consolidation or pleural effusion.   . Liver: No suspicious focal findings. . Gallbladder/Biliary: Unremarkable. SABRA Spleen: Unremarkable. . Pancreas: Unremarkable. . Adrenals: Unremarkable. . Kidneys: There is new moderate right-sided hydronephrosis with hydroureter. Transition is within abnormal soft tissue in the right pelvic sidewall.  There is additionally of left-sided extrarenal pelvis, however there is no obvious left-sided hydronephrosis.   . Peritoneum/Mesenteries/Extraperitoneum: No free air. New small volume ascites. Extensive peritoneal nodularity which is progressed from prior. For example, subcapsular hepatic implant on series 2 image 105 measures 4.8 x 1.6 cm, previously 3.6 x 1.3 cm. Right pelvic sidewall implant measures 5.0 x 3.1 cm, previously 4.0 x 2.4 cm. Extensive soft tissue involving the omentum. Enlargement is a rectal node on series 2 image 147 is increased, measuring 1.6 cm, previously 1.1 cm. . Gastrointestinal tract: No evidence of obstruction. Assessment of bowel loops is difficult given lack of contrast and extensive peritoneal deposits.   . Ureters: Right-sided hydroureter as above. Possible focal dilation of the left ureter (series 2 image 123).  . Bladder: Decompressed. . Reproductive System: Redemonstrated soft tissue in the resection bed, which may involve the rectum. This is difficult to accurately measure given absence of IV contrast, however this appears enlarged from prior.   . Vascular: Mild ectasia of the distal abdominal aorta. . Musculoskeletal: No acute displaced fractures. Polyarticular  degenerative changes. No aggressive focal bony lesions. Abdominal wall soft tissues unremarkable.   IMPRESSION: 1.  Findings concerning for disease progression, with new moderate right-sided hydronephrosis and hydroureter, with transition point in the right pelvic sidewall implant. 2.  While there is no definite left-sided hydronephrosis, there is a left extrarenal pelvis and possible focal dilation of the left ureter. 3.  New small volume ascites. 4.  Increased size of peritoneal implants. 5.  Other findings as above.  5.  Initiation of palliative chemotherapy with gemcitabine cisplatin day 1 day 8 on a 21-day cycle.  Held indefinitely.   Current Therapy: Symptomatic management  Interim Note: Loretta Rangel returns today for follow up of recurrent endometrial carcinoma.  Patient has lost considerable amount of weight.  Her p.o. intake is poor.  Her husband feels that she needs an appetite stimulant and we will give her Megace.  She does have pain and she is on medications for it including oxycodone.  She has poor exercise tolerance.  Allergies: Patient has no known allergies.  Medications: Current Outpatient Medications  Medication Sig Dispense Refill   acetaminophen  (TYLENOL ) 500 mg tablet Take 2 tablets (1,000 mg total) by mouth every 6 (six) hours.     apixaban (ELIQUIS) 2.5 mg tab Take 1 tablet (2.5 mg total) by mouth 2 (two) times a  day. 60 tablet 0   BisaCODYL (DULCOLAX) 5 mg EC tablet Take 5 mg by mouth daily as needed for constipation.     cephALEXin (KEFLEX) 250 mg capsule Take 1 capsule (250 mg total) by mouth daily. 30 capsule 0   diphenhydrAMINE-acetaminophen  (Tylenol  PM Extra Strength) 25-500 mg tablet Take 1 tablet by mouth nightly as needed.     ferrous gluconate 324 mg (38 mg iron) tablet Take 1 tablet (324 mg total) by mouth daily. (Patient taking differently: Take 37.5 mg of iron by mouth daily.) 90 tablet 0   loperamide (IMODIUM) 2 mg capsule Take 2 mg  by mouth as needed in the morning and 2 mg as needed at noon and 2 mg as needed in the evening and 2 mg as needed before bedtime for diarrhea.     ondansetron (ZOFRAN-ODT) 8 mg disintegrating tablet Dissolve 1 tablet (8 mg total) by mouth every 8 hours as needed (chemo induced nausea/vomiting) 30 tablet 3   oxyCODONE (ROXICODONE) 5 mg immediate release tablet Take 1-2 tablets (5-10 mg total) by mouth every 4 (four) hours as needed for moderate pain (4-6). 90 tablet 0   prochlorperazine (COMPAZINE) 10 mg tablet Take 1 tablet (10 mg total) by mouth every 6 (six) hours as needed (chemo induced nausea/vomiting) 30 tablet 3   tamsulosin (FLOMAX) 0.4 mg cap TAKE 1 CAPSULE BY MOUTH EVERY DAY (Patient taking differently: Take 0.4 mg by mouth daily.) 90 capsule 1   magnesium oxide 400 mg (241 mg magnesium) tab Take 1 tablet (400 mg total) by mouth daily. 90 tablet 1   OLANZapine (ZyPREXA) 5 mg tablet Take 2 tablets (10 mg total) by mouth at bedtime. 180 tablet 0   No current facility-administered medications for this visit.    Past Medical History: Past Medical History:  Diagnosis Date   Hypertension    Seasonal allergies    Uterine carcinoma    (CMD)     Past Surgical History: Past Surgical History:  Procedure Laterality Date   CARDIAC CATHETERIZATION Right 01/29/2024   CV CATHETERIZATION RIGHT HEART performed by Glendia Tanda Ee, MD at [facility] INVASIVE LAB   CESAREAN SECTION     FINGER SURGERY Left    pinky   INVASIVE VASCULAR PROCEDURE N/A 01/29/2024   CV VENOGRAM INFERIOR VENA CAVA performed by Glendia Tanda Ee, MD at [facility] INVASIVE LAB   INVASIVE VASCULAR PROCEDURE Bilateral 01/29/2024   Pulmonary angiogram performed by Glendia Tanda Ee, MD at [facility] INVASIVE LAB   ROBOTIC ASSISTED HYSTERECTOMY N/A 01/10/2023   ROBOTIC ASSISTED TOTAL LAPAROSCOPIC HYSTERECTOMY, BSO, BILATERAL SLN, OMENTAL BIOPSY performed by Therisa Lanna Ades, MD at [facility] OR   URETERAL STENT  PLACEMENT N/A 01/18/2024   RIGHT CYSTOSCOPY WITH STENT INSERTION/EXCHANGE WITH OR WITHOUT RETROGRADE PYELOGRAM performed by Amr Sherif Mohamed Rifaa Margart Sierras, MD at [facility] OR     Personal and Social History: Social History   Socioeconomic History   Marital status: Married  Tobacco Use   Smoking status: Never   Smokeless tobacco: Never  Substance and Sexual Activity   Alcohol use: Not Currently   Drug use: Never   Sexual activity: Never   Social Drivers of Health   Living Situation: Low Risk (02/13/2024)   Living Situation    What is your living situation today?: I have a steady place to live    Think about the place you live. Do you have problems with any of the following? Choose all that apply:: None/None on this list  Food  Insecurity: Low Risk (02/13/2024)   Food vital sign    Within the past 12 months, you worried that your food would run out before you got money to buy more: Never true    Within the past 12 months, the food you bought just didn't last and you didn't have money to get more: Never true  Transportation Needs: No Transportation Needs (02/13/2024)   Transportation    In the past 12 months, has lack of reliable transportation kept you from medical appointments, meetings, work or from getting things needed for daily living? : No  Utilities: Low Risk (02/13/2024)   Utilities    In the past 12 months has the electric, gas, oil, or water company threatened to shut off services in your home? : No  Safety: Low Risk (02/13/2024)   Safety    How often does anyone, including family and friends, physically hurt you?: Never    How often does anyone, including family and friends, insult or talk down to you?: Never    How often does anyone, including family and friends, threaten you with harm?: Never    How often does anyone, including family and friends, scream or curse at you?: Never  Alcohol Screening: Not At Risk (01/26/2024)   Alcohol    Audit C Alcohol risk  score: 0  Tobacco Use: Low Risk (03/25/2024)   Patient History    Smoking Tobacco Use: Never    Smokeless Tobacco Use: Never  Depression: Not At Risk (03/18/2024)   PHQ-2    PHQ-2 Score: 0    Family History: Cancer-related family history is negative for Breast cancer, Ovarian cancer, and Uterine cancer. She indicated that the status of her mother is unknown. She indicated that the status of her neg hx is unknown.   Results for orders placed or performed in visit on 03/25/24  CBC with Differential   Collection Time: 03/25/24  2:20 PM  Result Value Ref Range   WBC 9.53 4.40 - 11.00 10*3/uL   RBC 2.96 (L) 4.10 - 5.10 10*6/uL   Hemoglobin 8.3 (L) 12.3 - 15.3 g/dL   Hematocrit 74.6 (L) 64.0 - 44.6 %   Mean Corpuscular Volume (MCV) 85.4 80.0 - 96.0 fL   Mean Corpuscular Hemoglobin (MCH) 28.1 27.5 - 33.2 pg   Mean Corpuscular Hemoglobin Conc (MCHC) 32.8 (L) 33.0 - 37.0 g/dL   Red Cell Distribution Width (RDW) 17.7 (H) 12.3 - 17.0 %   Platelet Count (PLT) 304 150 - 450 10*3/uL   Mean Platelet Volume (MPV) 7.3 6.8 - 10.2 fL   Neutrophils % 77 %   Lymphocytes % 14 %   Monocytes % 8 %   Eosinophils % 0 %   Basophils % 1 %   Neutrophils Absolute 7.40 1.80 - 7.80 10*3/uL   Lymphocytes # 1.30 1.00 - 4.80 10*3/uL   Monocytes # 0.70 0.00 - 0.80 10*3/uL   Eosinophils # 0.00 0.00 - 0.50 10*3/uL   Basophils # 0.00 0.00 - 0.20 10*3/uL   US  Abdomen Limited Result Date: 03/04/2024 US  ABDOMEN LIMITED, 03/04/2024 1:02 PM INDICATION: Ascites previously requiring paracentesis. Limited ultrasound of the abdomen performed prior to possible paracentesis. COMPARISON: Paracentesis performed February 16, 2024 FINDINGS: Limited ultrasound done of all 4 quadrants. There is only trace volume of ascites present. There is no pocket of fluid large enough to allow for safe approach for paracentesis.   Trace volume ascites without pocket of fluid large enough to allow for safe approach for paracentesis.  Ultrasound performed  by: Sari Lamp, PA-C   Physical Examination: Vital Signs: There were no vitals taken for this visit. General: Thin female with temporal wasting, ECOG 2 HEENT: Normocephalic, atraumatic. PERRLA, EOMI, sclera anicteric.   Cardiovascular:  RRR without significant murmurs, gallops, or rubs.   Respiratory: Distant breath sounds Gastrointestinal:  Abdomen soft and without tenderness; no hepatosplenomegaly or other masses. No clinical ascites. Extremities:  No edema or suspicious rashes.  No cyanosis or clubbing. Skin:  No pathologic appearing petechiae or bruising noted. Neurologic: Alert and oriented to person, place, time and circumstance.      Assessment and plan:73 y.o. female with   1.  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 Therisa Mei-Ching Kuan-Celarier for consideration of systemic chemotherapy with paclitaxel and carboplatin for recurrent endometrial carcinoma.   S/P cycle 6 of Taxol carboplatin for recurrent endometrial carcinoma she completed the chemotherapy on 08/25/2023.  With this she did receive pembrolizumab as well.  Patient had restaging scans performed on 10/23/2023 which unfortunately reveals disease progression with increasingly widespread peritoneal disease as well as increased lobular soft tissue in the resection bed which may involve the rectum and possibly bladder dome.  Patient was seen by Dr. Willena who has now recommended retreatment.  Multiple options were discussed with the patient and I have reviewed her notes.  Patient has opted to proceed with doxorubicin given every 28 days.  She has had her echocardiogram performed.  She also had chemo education.  She will receive cycle 1 of doxorubicin day 1 on 11/07/2023, doses 60 mg/m.  She is instructed how to take her antinausea medicines and to push fluids. In spite of dose reducing doxorubicin patient still continues to struggle with that.  She has severe nausea and vomiting.  She  was initiated on Zyprexa but they did not fill the prescription because it is prescribed for schizophrenic's.  I have reassured them that we do use it in the oncology patients as well especially when they have intractable nausea.  They are going to have this filled.  Also concerning is that she is on fentanyl patch and this could potentially also be causing her to be nauseous and vomiting.  She will take the patch off.  Patient had CT of the chest abdomen pelvis performed on 01/04/2024 which showed findings concerning for disease progression, with new moderate right-sided hydronephrosis and hydroureter with transition point in the right pelvic sidewall implant.  No definite left-sided hydronephrosis.  There was a left extrarenal pelvis and possible focal dilation of the left ureter.  New small volume ascites, increased size of peritoneal implants.  Based on this patient has been seen by Dr. [place] who has recommended proceeding with a ureteral stent placement.  Also to initiate systemic chemotherapy with gemcitabine and cisplatin given day 1 day 8 on a 21-day cycle.  Patient is not a good candidate for chemotherapy. The risks of treatment far out way the benefits. No chemotherapy recommended. We continue supportive therapy with blood transfusions, platelet transfusions if needed, and paracentesis and thoracentesis if needed. Continue pain management.  Patient is continued on supportive care.  Any kind of systemic therapy is on hold for now due to patient's overall poor performance status.  We did do labs which show hemoglobin 8.1 hematocrit is 24.3 white count platelets are normal.  Her kidney function is improved with creatinine 1.03.  Potassium is still elevated at 5.5.  Alk phos 198 but transaminases are normal.  White count and platelets remain normal.  Today she does not require a blood transfusion as she is asymptomatic.  However I do think that she will periodically require transfusion support.  I will  plan on having her seen on a weekly basis for labs.  If her hemoglobin goes below 8 she will receive transfusion.  Family understands and agrees with these recommendations.  Patient was in for follow-up just to make sure she is doing well.  There have been a lot of discussions regarding hospice.  Daughter tells me that she is going to look into the different hospice options that are available to them.  Once she has visited or spoken to them then she will decide which hospice to send a referral to.  In the meantime we will continue monitoring her CBC transfuse if her platelets or hemoglobin hematocrit fall drastically or she is symptomatic.  We will continue pain management with oxycodone as needed.   2.  Pain management:  She will continue oxycodone for breakthrough pain.  Patient may be developing nausea and vomiting from fentanyl patch we will discontinue it.  3.  Antiemetics: Use of antiemetics as needed  4.  Disposition: Patient will continue to return to the clinic on a weekly basis to have labs performed including CBC, BMP (monitoring potassium).  If potassium is high she will take Kayexalate.  Her daughter tells me that she still has diarrhea so we will use Kayexalate when her potassium is high.  Patient is encouraged to continue to push fluids at home but certainly if she needs IV fluids we will get her scheduled.  Terms of hospice discussion family is still working on deciding hospice and timing when to enroll her.  Patient's husband is concerned about her appetite and Megace was given to see if we could improve her appetite.     A total of 30 minutes was spent in patient care today.  Greater than 50% of my time was spent face-to-face and nonface-to-face with the patient, reviewing her medical records, completing a physical examination, going over the results of the radiology reports, laboratory results, treatment of endometrial carcinoma, completing treatment plan and answering the patient  and family's questions. Patient is encouraged to call with any questions or concerns if they should arise prior to next scheduled office visit.   This record has been created using Conservation officer, historic buildings.

## 2024-04-05 ENCOUNTER — Emergency Department (HOSPITAL_COMMUNITY)

## 2024-04-05 ENCOUNTER — Observation Stay (HOSPITAL_COMMUNITY)
Admission: EM | Admit: 2024-04-05 | Discharge: 2024-04-11 | Disposition: E | Attending: Emergency Medicine | Admitting: Emergency Medicine

## 2024-04-05 DIAGNOSIS — R579 Shock, unspecified: Secondary | ICD-10-CM | POA: Insufficient documentation

## 2024-04-05 DIAGNOSIS — C541 Malignant neoplasm of endometrium: Secondary | ICD-10-CM

## 2024-04-05 DIAGNOSIS — E875 Hyperkalemia: Principal | ICD-10-CM

## 2024-04-05 DIAGNOSIS — E870 Hyperosmolality and hypernatremia: Secondary | ICD-10-CM | POA: Diagnosis not present

## 2024-04-05 DIAGNOSIS — Z515 Encounter for palliative care: Secondary | ICD-10-CM

## 2024-04-05 DIAGNOSIS — I129 Hypertensive chronic kidney disease with stage 1 through stage 4 chronic kidney disease, or unspecified chronic kidney disease: Secondary | ICD-10-CM | POA: Diagnosis not present

## 2024-04-05 DIAGNOSIS — Z86718 Personal history of other venous thrombosis and embolism: Secondary | ICD-10-CM | POA: Diagnosis not present

## 2024-04-05 DIAGNOSIS — E872 Acidosis, unspecified: Secondary | ICD-10-CM | POA: Insufficient documentation

## 2024-04-05 DIAGNOSIS — I10 Essential (primary) hypertension: Secondary | ICD-10-CM

## 2024-04-05 DIAGNOSIS — E162 Hypoglycemia, unspecified: Secondary | ICD-10-CM | POA: Diagnosis not present

## 2024-04-05 DIAGNOSIS — D649 Anemia, unspecified: Secondary | ICD-10-CM | POA: Insufficient documentation

## 2024-04-05 DIAGNOSIS — G9341 Metabolic encephalopathy: Secondary | ICD-10-CM | POA: Insufficient documentation

## 2024-04-05 DIAGNOSIS — N1330 Unspecified hydronephrosis: Secondary | ICD-10-CM

## 2024-04-05 DIAGNOSIS — C7989 Secondary malignant neoplasm of other specified sites: Secondary | ICD-10-CM | POA: Diagnosis not present

## 2024-04-05 DIAGNOSIS — K5900 Constipation, unspecified: Secondary | ICD-10-CM

## 2024-04-05 DIAGNOSIS — Z79899 Other long term (current) drug therapy: Secondary | ICD-10-CM | POA: Diagnosis not present

## 2024-04-05 DIAGNOSIS — R55 Syncope and collapse: Secondary | ICD-10-CM | POA: Diagnosis present

## 2024-04-05 DIAGNOSIS — N1831 Chronic kidney disease, stage 3a: Secondary | ICD-10-CM | POA: Diagnosis not present

## 2024-04-05 DIAGNOSIS — I469 Cardiac arrest, cause unspecified: Secondary | ICD-10-CM | POA: Diagnosis not present

## 2024-04-05 DIAGNOSIS — Z86711 Personal history of pulmonary embolism: Secondary | ICD-10-CM | POA: Diagnosis not present

## 2024-04-05 DIAGNOSIS — N189 Chronic kidney disease, unspecified: Secondary | ICD-10-CM | POA: Diagnosis not present

## 2024-04-05 DIAGNOSIS — D509 Iron deficiency anemia, unspecified: Secondary | ICD-10-CM

## 2024-04-05 DIAGNOSIS — R180 Malignant ascites: Secondary | ICD-10-CM

## 2024-04-05 DIAGNOSIS — J9601 Acute respiratory failure with hypoxia: Secondary | ICD-10-CM | POA: Diagnosis not present

## 2024-04-05 DIAGNOSIS — G893 Neoplasm related pain (acute) (chronic): Secondary | ICD-10-CM | POA: Diagnosis not present

## 2024-04-05 DIAGNOSIS — Z8542 Personal history of malignant neoplasm of other parts of uterus: Secondary | ICD-10-CM | POA: Insufficient documentation

## 2024-04-05 DIAGNOSIS — E878 Other disorders of electrolyte and fluid balance, not elsewhere classified: Secondary | ICD-10-CM | POA: Insufficient documentation

## 2024-04-05 DIAGNOSIS — N179 Acute kidney failure, unspecified: Secondary | ICD-10-CM | POA: Diagnosis not present

## 2024-04-05 LAB — I-STAT CHEM 8, ED
BUN: 125 mg/dL — ABNORMAL HIGH (ref 8–23)
Calcium, Ion: 1.04 mmol/L — ABNORMAL LOW (ref 1.15–1.40)
Chloride: 123 mmol/L — ABNORMAL HIGH (ref 98–111)
Creatinine, Ser: 4.2 mg/dL — ABNORMAL HIGH (ref 0.44–1.00)
Glucose, Bld: 64 mg/dL — ABNORMAL LOW (ref 70–99)
HCT: 17 % — ABNORMAL LOW (ref 36.0–46.0)
Hemoglobin: 5.8 g/dL — CL (ref 12.0–15.0)
Potassium: 6.9 mmol/L (ref 3.5–5.1)
Sodium: 146 mmol/L — ABNORMAL HIGH (ref 135–145)
TCO2: 12 mmol/L — ABNORMAL LOW (ref 22–32)

## 2024-04-05 LAB — I-STAT ARTERIAL BLOOD GAS, ED
Acid-base deficit: 18 mmol/L — ABNORMAL HIGH (ref 0.0–2.0)
Bicarbonate: 11 mmol/L — ABNORMAL LOW (ref 20.0–28.0)
Calcium, Ion: 1.41 mmol/L — ABNORMAL HIGH (ref 1.15–1.40)
HCT: 18 % — ABNORMAL LOW (ref 36.0–46.0)
Hemoglobin: 6.1 g/dL — CL (ref 12.0–15.0)
O2 Saturation: 99 %
Patient temperature: 97.5
Potassium: 7.9 mmol/L (ref 3.5–5.1)
Sodium: 143 mmol/L (ref 135–145)
TCO2: 12 mmol/L — ABNORMAL LOW (ref 22–32)
pCO2 arterial: 41.3 mmHg (ref 32–48)
pH, Arterial: 7.03 — CL (ref 7.35–7.45)
pO2, Arterial: 228 mmHg — ABNORMAL HIGH (ref 83–108)

## 2024-04-05 LAB — I-STAT VENOUS BLOOD GAS, ED
Acid-base deficit: 20 mmol/L — ABNORMAL HIGH (ref 0.0–2.0)
Bicarbonate: 10 mmol/L — ABNORMAL LOW (ref 20.0–28.0)
Calcium, Ion: 1.05 mmol/L — ABNORMAL LOW (ref 1.15–1.40)
HCT: 16 % — ABNORMAL LOW (ref 36.0–46.0)
Hemoglobin: 5.4 g/dL — CL (ref 12.0–15.0)
O2 Saturation: 90 %
Potassium: 6.9 mmol/L (ref 3.5–5.1)
Sodium: 147 mmol/L — ABNORMAL HIGH (ref 135–145)
TCO2: 11 mmol/L — ABNORMAL LOW (ref 22–32)
pCO2, Ven: 45.1 mmHg (ref 44–60)
pH, Ven: 6.953 — CL (ref 7.25–7.43)
pO2, Ven: 92 mmHg — ABNORMAL HIGH (ref 32–45)

## 2024-04-05 LAB — I-STAT CG4 LACTIC ACID, ED: Lactic Acid, Venous: 8.4 mmol/L (ref 0.5–1.9)

## 2024-04-05 LAB — ABO/RH: ABO/RH(D): O POS

## 2024-04-05 LAB — PREPARE RBC (CROSSMATCH)

## 2024-04-05 LAB — CBG MONITORING, ED
Glucose-Capillary: 34 mg/dL — CL (ref 70–99)
Glucose-Capillary: 143 mg/dL — ABNORMAL HIGH (ref 70–99)

## 2024-04-05 MED ORDER — PROPOFOL 1000 MG/100ML IV EMUL: 0.0000 ug/kg/min | INTRAVENOUS | Status: DC

## 2024-04-05 MED ORDER — CALCIUM CHLORIDE 10 % IV SOLN
INTRAVENOUS | Status: AC | PRN
  Administered 2024-04-05: 1 g via INTRAVENOUS

## 2024-04-05 MED ORDER — EPINEPHRINE HCL 5 MG/250ML IV SOLN IN NS
0.5000 ug/min | INTRAVENOUS | Status: DC
  Administered 2024-04-05: 10 ug/min via INTRAVENOUS

## 2024-04-05 MED ORDER — ONDANSETRON 4 MG PO TBDP: 4.0000 mg | ORAL_TABLET | Freq: Four times a day (QID) | ORAL | Status: DC | PRN

## 2024-04-05 MED ORDER — SODIUM BICARBONATE 8.4 % IV SOLN
INTRAVENOUS | Status: AC | PRN
  Administered 2024-04-05: 50 meq via INTRAVENOUS

## 2024-04-05 MED ORDER — FENTANYL BOLUS VIA INFUSION
25.0000 ug | INTRAVENOUS | Status: DC | PRN
  Administered 2024-04-05: 100 ug via INTRAVENOUS
  Administered 2024-04-05: 25 ug via INTRAVENOUS
  Administered 2024-04-05: 100 ug via INTRAVENOUS

## 2024-04-05 MED ORDER — INSULIN ASPART 100 UNIT/ML IV SOLN
10.0000 [IU] | Freq: Once | INTRAVENOUS | Status: AC
  Administered 2024-04-05: 10 [IU] via INTRAVENOUS
  Filled 2024-04-05: qty 10

## 2024-04-05 MED ORDER — EPINEPHRINE 1 MG/10ML IV SOSY
PREFILLED_SYRINGE | INTRAVENOUS | Status: AC | PRN
  Administered 2024-04-05: 1 mg via INTRAVENOUS

## 2024-04-05 MED ORDER — GLYCOPYRROLATE 1 MG PO TABS: 1.0000 mg | ORAL_TABLET | ORAL | Status: DC | PRN

## 2024-04-05 MED ORDER — GLYCOPYRROLATE 0.2 MG/ML IJ SOLN: 0.2000 mg | INTRAMUSCULAR | Status: DC | PRN

## 2024-04-05 MED ORDER — POLYVINYL ALCOHOL 1.4 % OP SOLN: 1.0000 [drp] | Freq: Four times a day (QID) | OPHTHALMIC | Status: DC | PRN

## 2024-04-05 MED ORDER — ONDANSETRON HCL 4 MG/2ML IJ SOLN: 4.0000 mg | Freq: Four times a day (QID) | INTRAMUSCULAR | Status: DC | PRN

## 2024-04-05 MED ORDER — EPINEPHRINE 1 MG/10ML IV SOSY
PREFILLED_SYRINGE | INTRAVENOUS | Status: AC | PRN
  Administered 2024-04-05 (×2): 1 mg via INTRAVENOUS

## 2024-04-05 MED ORDER — AMIODARONE HCL 150 MG/3ML IV SOLN
INTRAVENOUS | Status: AC | PRN
  Administered 2024-04-05: 150 mg via INTRAVENOUS
  Administered 2024-04-05: 300 mg via INTRAVENOUS

## 2024-04-05 MED ORDER — AMIODARONE IV BOLUS ONLY 150 MG/100ML: INTRAVENOUS | Status: AC | PRN

## 2024-04-05 MED ORDER — BIOTENE DRY MOUTH MT LIQD: 15.0000 mL | OROMUCOSAL | Status: DC | PRN

## 2024-04-05 MED ORDER — ATROPINE SULFATE 1 MG/10ML IJ SOSY
PREFILLED_SYRINGE | INTRAMUSCULAR | Status: AC | PRN
  Administered 2024-04-05: 1 mg via INTRAVENOUS

## 2024-04-05 MED ORDER — NOREPINEPHRINE 4 MG/250ML-% IV SOLN
0.0000 ug/min | INTRAVENOUS | Status: DC
  Administered 2024-04-05: 20 ug/min via INTRAVENOUS

## 2024-04-05 MED ORDER — FENTANYL CITRATE (PF) 50 MCG/ML IJ SOSY
50.0000 ug | PREFILLED_SYRINGE | INTRAMUSCULAR | Status: DC | PRN
  Administered 2024-04-06: 50 ug via INTRAVENOUS
  Filled 2024-04-05: qty 1

## 2024-04-05 MED ORDER — LORAZEPAM 2 MG/ML IJ SOLN
2.0000 mg | INTRAMUSCULAR | Status: DC | PRN
  Administered 2024-04-05: 2 mg via INTRAVENOUS
  Filled 2024-04-05: qty 1

## 2024-04-05 MED ORDER — ACETAMINOPHEN 650 MG RE SUPP: 650.0000 mg | Freq: Four times a day (QID) | RECTAL | Status: DC | PRN

## 2024-04-05 MED ORDER — ACETAMINOPHEN 325 MG PO TABS: 650.0000 mg | ORAL_TABLET | Freq: Four times a day (QID) | ORAL | Status: DC | PRN

## 2024-04-05 MED ORDER — DEXTROSE 50 % IV SOLN
1.0000 | Freq: Once | INTRAVENOUS | Status: AC
  Administered 2024-04-05: 50 mL via INTRAVENOUS
  Filled 2024-04-05: qty 50

## 2024-04-05 MED ORDER — SODIUM CHLORIDE 0.9% IV SOLUTION: Freq: Once | INTRAVENOUS | Status: DC

## 2024-04-05 MED ORDER — SODIUM CHLORIDE 0.9 % IV SOLN: 250.0000 mL | INTRAVENOUS | Status: DC

## 2024-04-05 MED ORDER — DEXTROSE 50 % IV SOLN
INTRAVENOUS | Status: AC | PRN
  Administered 2024-04-05: 50 mL via INTRAVENOUS

## 2024-04-05 MED ORDER — ALBUTEROL SULFATE (2.5 MG/3ML) 0.083% IN NEBU
10.0000 mg | INHALATION_SOLUTION | Freq: Once | RESPIRATORY_TRACT | Status: AC
  Administered 2024-04-05: 10 mg via RESPIRATORY_TRACT
  Filled 2024-04-05: qty 12

## 2024-04-05 MED ORDER — FENTANYL 2500MCG IN NS 250ML (10MCG/ML) PREMIX INFUSION
0.0000 ug/h | INTRAVENOUS | Status: DC
  Administered 2024-04-05: 50 ug/h via INTRAVENOUS
  Administered 2024-04-05: 75 ug/h via INTRAVENOUS
  Administered 2024-04-05 (×2): 25 ug/h via INTRAVENOUS
  Filled 2024-04-05: qty 250

## 2024-04-05 MED ORDER — MAGNESIUM SULFATE 2 GM/50ML IV SOLN
2.0000 g | Freq: Once | INTRAVENOUS | Status: AC
  Administered 2024-04-05: 2 g via INTRAVENOUS
  Filled 2024-04-05: qty 50

## 2024-04-05 NOTE — ED Triage Notes (Signed)
 Pt BIB [facility] from home under [facility]osp[facility] as post- cpr.  EMS arrived and found pt pulseless and apneic.  Family and fire had performed CPR for 10-15 min.  EMS performed 2 rounds of CPR with 2 epi's, 1 bicarb, 1 calcium  and achieved ROSC. Pt had pulses on arrival but quickly lost them and we egan cpr in hospital.   CBG 80, ETC02: 30

## 2024-04-05 NOTE — Progress Notes (Signed)
" °   04/05/24 1736  Spiritual Encounters  Type of Visit Initial  Care provided to: Pt and family  Conversation partners present during encounter Nurse  Referral source Family  Reason for visit End-of-life  OnCall Visit Yes   Patient moved to comfort care, intubation removed. Family of 16 present at bedside. Provided spiritual support, prayers and songs   "

## 2024-04-05 NOTE — H&P (Signed)
 " History and Physical    Loretta Rangel FMW:[id number] DOB: 11/03/1950 DOA: 04/05/2024  PCP: Pcp, No  Patient coming from: Home  Chief Complaint: Cardiac arrest  HPI: Loretta Rangel is a 73 y.o. 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  Patient presents to the ED today with cardiac arrest at home.  When EMS arrived patient was found pulseless and apneic.  Family and fire department performed CPR for 10-15 minutes.  Initial rhythm PEA and EMS performed 2 rounds of CPR with 2 epinephrine , 1 bicarb, 1 calcium  given and achieved ROSC.  Upon arrival to the ED, patient hypotensive and became pulseless again and CPR was resumed and she was given additional epinephrine .  She appeared to be in V. tach/V-fib and was cardioverted and defibrillated and given amiodarone .  She was given additional calcium  and bicarb.  ROSC achieved after approximately 16 minutes later.  Labs notable for worsening renal function (BUN 125, creatinine 4.2), severe hyperkalemia (potassium 7.9), severe metabolic acidosis (bicarb 12 and pH 6.95 on blood gas), severe anemia (hemoglobin 5.8), severe lactic acidosis (lactate 8.4), hypoglycemia (CBG 34 on arrival and 64 on i-STAT chemistry), hypernatremia (sodium 147), and hyperchloremia (chloride 123).  Patient was given insulin /dextrose  and albuterol  for hyperkalemia.  Started on epinephrine  and Levophed  drip as family initially wanted full scope of care.  Nephrology and critical care were consulted.  Nephrology felt that the patient was not a candidate for dialysis given prognosis and advanced malignancy.  Critical  care evaluated the patient and after further goals of care discussion, family decided to transition the patient to full comfort care.  Patient was extubated and vasopressors stopped.  TRH called to admit for full comfort care/end-of-life care.  Patient is obtunded.  Several family members at bedside including daughter, son, and grandchildren.  Family confirms that they do not want any further interventions/further escalation of care and they understand that the patient is critically ill and dying.  Family understands that the patient is being admitted to the hospital for full comfort care/end-of-life care and will receive PRN meds for pain/anxiety/comfort.    Review of Systems:  Review of Systems  Reason unable to perform ROS: AMS.  All other systems reviewed and are negative.   Past Medical History:  Diagnosis Date   Hypertension     Past Surgical History:  Procedure Laterality Date   CESAREAN SECTION     1979   FINGER SURGERY       reports that she has never smoked. She does not have any smokeless tobacco history on file. She reports that she does not drink alcohol  and does not use drugs.  Allergies[1]  Family History  Problem Relation Age of Onset   Colon cancer Neg Hx    Breast cancer Neg Hx    Ovarian cancer Neg Hx    Endometrial cancer Neg Hx    Pancreatic cancer Neg Hx    Prostate cancer Neg Hx     Prior to Admission medications  Medication Sig Start Date End Date Taking? Authorizing Provider  Cholecalciferol (D 1000) 25 MCG (1000 UT) capsule Take 1,000 Units by mouth daily. 11/22/22   [provider]  cyanocobalamin (VITAMIN B12) 1000 MCG tablet Take 1,000 mcg by mouth daily.  [provider]  lisinopril -hydrochlorothiazide (ZESTORETIC) 20-12.5 MG tablet Take 1 tablet by mouth daily.    [provider]  medroxyPROGESTERone (PROVERA) 10 MG tablet Take 10 mg by mouth daily. 11/29/22   [provider]  OVER THE COUNTER MEDICATION  Place 1 drop into both eyes daily as needed (Tired or red eyes). Rohto eye drops    [provider]    Physical Exam: Vitals:   04/05/24 1755 04/05/24 1800 04/05/24 1805 04/05/24 1810  BP: 106/66     Pulse: 92 92 92   Resp: (!) 34 (!) 21 14 11   Temp:      TempSrc:      SpO2: 100% 98% 94%   Height:        Physical Exam Vitals reviewed.  HENT:     Head: Normocephalic and atraumatic.  Cardiovascular:     Rate and Rhythm: Normal rate and regular rhythm.  Pulmonary:     Breath sounds: No wheezing.     Comments: Agonal respirations Abdominal:     General: Bowel sounds are normal.     Palpations: Abdomen is soft.     Tenderness: There is no abdominal tenderness. There is no guarding.  Musculoskeletal:     Right lower leg: No edema.     Left lower leg: No edema.  Skin:    General: Skin is warm and dry.  Neurological:     Comments: Obtunded     Labs on Admission: I have personally reviewed following labs and imaging studies  CBC: Recent Labs  Lab 04/05/24 1425 04/05/24 1427 04/05/24 1451  HGB 5.8* 5.4* 6.1*  HCT 17.0* 16.0* 18.0*   Basic Metabolic Panel: Recent Labs  Lab 04/05/24 1425 04/05/24 1427 04/05/24 1451  NA 146* 147* 143  K 6.9* 6.9* 7.9*  CL 123*  --   --   GLUCOSE 64*  --   --   BUN 125*  --   --   CREATININE 4.20*  --   --    GFR: CrCl cannot be calculated (Unknown ideal weight.). Liver Function Tests: No results for input(s): AST, ALT, ALKPHOS, BILITOT, PROT, ALBUMIN in the last 168 hours. No results for input(s): LIPASE, AMYLASE in the last 168 hours. No results for input(s): AMMONIA in the last 168 hours. Coagulation Profile: No results for input(s): INR, PROTIME in the last 168 hours. Cardiac Enzymes: No results for input(s): CKTOTAL, CKMB, CKMBINDEX, TROPONINI in the last 168 hours. BNP (last 3 results) No results for input(s): PROBNP in the last 8760 hours. HbA1C: No results for input(s):  HGBA1C in the last 72 hours. CBG: Recent Labs  Lab 04/05/24 1414 04/05/24 1429  GLUCAP 34* 143*   Lipid Profile: No results for input(s): CHOL, HDL, LDLCALC, TRIG, CHOLHDL, LDLDIRECT in the last 72 hours. Thyroid Function Tests: No results for input(s): TSH, T4TOTAL, FREET4, T3FREE, THYROIDAB in the last 72 hours. Anemia Panel: No results for input(s): VITAMINB12, FOLATE, FERRITIN, TIBC, IRON, RETICCTPCT in the last 72 hours. Urine analysis: No results found for: COLORURINE, APPEARANCEUR, LABSPEC, PHURINE, GLUCOSEU, HGBUR, BILIRUBINUR, KETONESUR, PROTEINUR, UROBILINOGEN, NITRITE, LEUKOCYTESUR  Radiological Exams on Admission: DG Chest Portable 1 View Result Date: 04/05/2024 CLINICAL DATA:  Cardiac arrest EXAM: PORTABLE CHEST 1 VIEW COMPARISON:  None Available. FINDINGS: The heart size and mediastinal contours are within normal limits. Endotracheal and nasogastric tubes are in grossly good position. Right internal jugular Port-A-Cath is noted with distal tip in right atrium. Both lungs are clear. The visualized skeletal structures are  unremarkable. IMPRESSION: Endotracheal and nasogastric tubes are in grossly good position. No acute cardiopulmonary disease. Electronically Signed   By: Lynwood Landy Raddle M.D.   On: 04/05/2024 15:49    Assessment and Plan  PEA/VT/VF cardiac arrest Shock-undifferentiated, likely multifactorial Severe lactic acidosis AKI on CKD stage IIIa Severe hyperkalemia Severe metabolic acidosis Acute hypoxic respiratory failure Metabolic encephalopathy, cannot rule out anoxic injury with prolonged arrest Acute on chronic anemia Hypoglycemia Hypernatremia Hyperchloremia Nephrology felt that the patient was not a candidate for dialysis given poor prognosis and advanced malignancy.  Critical care evaluated the patient and after further goals of care discussion, family decided to transition the patient to full  comfort care.  Patient was extubated and vasopressors stopped in the ED.  Patient is currently obtunded. 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 The aforementioned taken together are felt to place the patient at high risk for further clinical deterioration. However, it is anticipated that the patient may be medically stable for discharge from the hospital within 24 to 48 hours.  Editha Ram MD [facility]  If 7PM-7AM, please contact night-coverage [email or web address]  04/05/2024, 7:58 PM       [1] No Known Allergies  "

## 2024-04-05 NOTE — Progress Notes (Signed)
" °   04/05/24 2100  Spiritual Encounters  Type of Visit Follow up  Care provided to: [facility] partners present during encounter Nurse  Reason for visit End-of-life  OnCall Visit Yes   Returned to further assist family with spiritual care.  Provided additional private support to spouse and daughter as there was a large family presence and both required individual support.  "

## 2024-04-05 NOTE — Progress Notes (Signed)
 [facility]  [facility] Liaison Note  This patient is a current hospice patient with [facility] admitted with a terminal diagnosis of Edometrial Cancer.  Patient is a full code and [name] [phone] is the primary caregiver.  We will continue follow for any discharge planning needs and to coordinate continuation of hospice care.  Please don't hesitate to call with any hospice related questions or concerns.  Thank you Inocente Jacobs RN BSN [facility] Liaison [phone]

## 2024-04-05 NOTE — Progress Notes (Signed)
 Patient extubated to comfort per written order with RT, RN and family at bedside.

## 2024-04-05 NOTE — ED Provider Notes (Signed)
 "  EMERGENCY DEPARTMENT AT [facility] Provider Note   CSN: [id number] Arrival date & time: 04/05/24  1352     Patient presents with: No chief complaint on file.   Loretta Rangel is a 73 y.o. female.   This is a 73 year old female presenting emergency department from home.  Witnessed arrest by family.  Arrived being bagged unresponsive.  No pulses on arrival        Prior to Admission medications  Medication Sig Start Date End Date Taking? Authorizing Provider  Cholecalciferol (D 1000) 25 MCG (1000 UT) capsule Take 1,000 Units by mouth daily. 11/22/22   [provider]  cyanocobalamin (VITAMIN B12) 1000 MCG tablet Take 1,000 mcg by mouth daily.    [provider]  lisinopril -hydrochlorothiazide (ZESTORETIC) 20-12.5 MG tablet Take 1 tablet by mouth daily.    [provider]  medroxyPROGESTERone (PROVERA) 10 MG tablet Take 10 mg by mouth daily. 11/29/22   [provider]  OVER THE COUNTER MEDICATION Place 1 drop into both eyes daily as needed (Tired or red eyes). Rohto eye drops    [provider]    Allergies: Patient has no known allergies.    Review of Systems  Updated Vital Signs BP 101/61   Pulse 76   Temp (!) 97.2 F (36.2 C) (Temporal)   Resp 16   Ht 5' 5 (1.651 m)   SpO2 100%   Physical Exam Vitals and nursing note reviewed.  Constitutional:      Appearance: She is ill-appearing.  HENT:     Nose: Nose normal.  Eyes:     Comments: Pupils midrange  Cardiovascular:     Comments: No pulse Pulmonary:     Comments: Equal breath sound being bagged, but coarse Abdominal:     General: There is distension.  Musculoskeletal:     Right lower leg: No edema.     Left lower leg: No edema.  Skin:    General: Skin is warm.  Neurological:     Comments: Not possible to assess.  No gag reflex during intubation  Psychiatric:     Comments: Not possible to assess     (all labs ordered are listed, but  only abnormal results are displayed) Labs Reviewed  I-STAT CHEM 8, ED - Abnormal; Notable for the following components:      Result Value   Sodium 146 (*)    Potassium 6.9 (*)    Chloride 123 (*)    BUN 125 (*)    Creatinine, Ser 4.20 (*)    Glucose, Bld 64 (*)    Calcium , Ion 1.04 (*)    TCO2 12 (*)    Hemoglobin 5.8 (*)    HCT 17.0 (*)    All other components within normal limits  CBG MONITORING, ED - Abnormal; Notable for the following components:   Glucose-Capillary 34 (*)    All other components within normal limits  I-STAT ARTERIAL BLOOD GAS, ED - Abnormal; Notable for the following components:   pH, Arterial 7.030 (*)    pO2, Arterial 228 (*)    Bicarbonate 11.0 (*)    TCO2 12 (*)    Acid-base deficit 18.0 (*)    Potassium 7.9 (*)    Calcium , Ion 1.41 (*)    HCT 18.0 (*)    Hemoglobin 6.1 (*)    All other components within normal limits  I-STAT CG4 LACTIC ACID, ED - Abnormal; Notable for the following components:   Lactic Acid, Venous 8.4 (*)  All other components within normal limits  CBG MONITORING, ED - Abnormal; Notable for the following components:   Glucose-Capillary 143 (*)    All other components within normal limits  I-STAT VENOUS BLOOD GAS, ED - Abnormal; Notable for the following components:   pH, Ven 6.953 (*)    pO2, Ven 92 (*)    Bicarbonate 10.0 (*)    TCO2 11 (*)    Acid-base deficit 20.0 (*)    Sodium 147 (*)    Potassium 6.9 (*)    Calcium , Ion 1.05 (*)    HCT 16.0 (*)    Hemoglobin 5.4 (*)    All other components within normal limits  TYPE AND SCREEN  PREPARE RBC (CROSSMATCH)  ABO/RH    EKG: None  Radiology: DG Chest Portable 1 View Result Date: 04/05/2024 CLINICAL DATA:  Cardiac arrest EXAM: PORTABLE CHEST 1 VIEW COMPARISON:  None Available. FINDINGS: The heart size and mediastinal contours are within normal limits. Endotracheal and nasogastric tubes are in grossly good position. Right internal jugular Port-A-Cath is noted with  distal tip in right atrium. Both lungs are clear. The visualized skeletal structures are unremarkable. IMPRESSION: Endotracheal and nasogastric tubes are in grossly good position. No acute cardiopulmonary disease. Electronically Signed   By: Lynwood Landy Raddle M.D.   On: 04/05/2024 15:49     .Critical Care  Performed by: Neysa Caron PARAS, DO Authorized by: Neysa Caron PARAS, DO   Critical care provider statement:    Critical care time (minutes):  75   Critical care was necessary to treat or prevent imminent or life-threatening deterioration of the following conditions:  Cardiac failure, circulatory failure, CNS failure or compromise and renal failure   Critical care was time spent personally by me on the following activities:  Development of treatment plan with patient or surrogate, discussions with consultants, evaluation of patient's response to treatment, examination of patient, ordering and review of laboratory studies, ordering and review of radiographic studies, ordering and performing treatments and interventions, pulse oximetry, re-evaluation of patient's condition and review of old charts   Care discussed with: admitting provider      Medications Ordered in the ED  EPINEPHrine  (ADRENALIN ) 5 mg in NS 250 mL (0.02 mg/mL) premix infusion (20 mcg/min Intravenous Rate/Dose Verify 04/05/24 1639)  0.9 %  sodium chloride  infusion (has no administration in time range)  norepinephrine  (LEVOPHED ) 4mg  in (0.016 mg/mL) premix infusion (20 mcg/min Intravenous Rate/Dose Verify 04/05/24 1639)  fentaNYL  in NS (77mcg/ml) infusion-PREMIX (25 mcg/hr Intravenous Rate/Dose Verify 04/05/24 1638)  fentaNYL  (SUBLIMAZE ) bolus via infusion 25-100 mcg (has no administration in time range)  propofol  (DIPRIVAN ) 1000 MG/100ML infusion (has no administration in time range)  0.9 %  sodium chloride  infusion (Manually program via Guardrails IV Fluids) (has no administration in time range)  glycopyrrolate   (ROBINUL ) injection 0.2 mg (has no administration in time range)  artificial tears ophthalmic solution 1 drop (has no administration in time range)  LORazepam  (ATIVAN ) injection 2-4 mg (has no administration in time range)  magnesium  sulfate IVPB 2 g 50 mL (0 g Intravenous Stopped 04/05/24 1510)  EPINEPHrine  (ADRENALIN ) 1 MG/10ML injection (1 mg Intravenous Given 04/05/24 1411)  amiodarone  (NEXTERONE ) 1.5 mg/mL IV bolus only (300 mg Intravenous Canceled Entry 04/05/24 1359)  insulin  aspart (novoLOG ) injection 10 Units (10 Units Intravenous Given 04/05/24 1447)    And  dextrose  50 % solution 50 mL (50 mLs Intravenous Given 04/05/24 1448)  albuterol  (PROVENTIL ) (2.5 MG/3ML) 0.083% nebulizer solution 10  mg (10 mg Nebulization Given 04/05/24 1544)  amiodarone  (CORDARONE ) injection (150 mg Intravenous Given 04/05/24 1403)  EPINEPHrine  (ADRENALIN ) 1 MG/10ML injection (1 mg Intravenous Given 04/05/24 1404)  atropine  1 MG/10ML injection (1 mg Intravenous Given 04/05/24 1408)  sodium bicarbonate  injection (50 mEq Intravenous Given 04/05/24 1412)  calcium  chloride injection (1 g Intravenous Given 04/05/24 1412)  dextrose  50 % solution (50 mLs Intravenous Given 04/05/24 1414)    Clinical Course as of 04/05/24 1646  Fri Apr 05, 2024  1450 Hemoglobin(!!): 5.4 2 units of blood ordered [TY]  1450 Potassium(!!): 6.9 Likely cause of her arrest. [TY]  1504 Consulted nephrology for emergent dialysis given patient's markedly elevated potassium. [TY]    Clinical Course User Index [TY] Neysa Caron PARAS, DO                                 Medical Decision Making Is a 73 year old female with history of hypertension, stage II endometrial cancer presenting emergency department with a witnessed arrest at home.  Family did CPR.  Initial rhythm PEA per EMS received 2 doses of epi, calcium  and bicarb with ROSC.  On arrival had no pulses.  CPR resumed received additional epi, did have appear to be V. tach/V-fib, was  cardioverted and defibrillated given Amio.  Given additional calcium  and bicarb.  ROSC obtained after roughly 16 minutes here in the emergency department.  Goals of care discussion with family members who initially wanted everything done.  Workup concerning for patient hyperkalemia causing arrest.  Also with significant anemia.  Discussed case with nephrology for dialysis.  Discussed with ICU for admission.  However after further goals of care discussion with family and they did not want escalation of care, but wanted to keep things as is until further family could arrive.  After further discussion with family and the ICU team would like to make patient comfort measures/withdrawal care after other family arrives.  Care signed out to afternoon team  Amount and/or Complexity of Data Reviewed Independent Historian: EMS    Details: Provided  HPI and meds given External Data Reviewed:     Details: Appears she had some hypomagnesemia recently as well as hyperkalemia on recent labs.  Recently started on morphine as well. Labs: ordered. Decision-making details documented in ED Course. Radiology: ordered and independent interpretation performed.    Details: Chest x-ray with adequate ET tube placement ECG/medicine tests: ordered and independent interpretation performed.    Details: Wide-complex changes.  Risk OTC drugs. Prescription drug management. Decision regarding hospitalization. Decision not to resuscitate or to de-escalate care because of poor prognosis. Diagnosis or treatment significantly limited by social determinants of health.        Final diagnoses:  Hyperkalemia    ED Discharge Orders     None          Neysa Caron PARAS, DO 04/05/24 1646  "

## 2024-04-05 NOTE — Progress Notes (Signed)
" °   04/05/24 1615  Spiritual Encounters  Type of Visit Initial  Care provided to: Pt and family  Conversation partners present during encounter Nurse  Referral source Nurse (RN/NT/LPN)  Reason for visit End-of-life  OnCall Visit No  Spiritual Framework  Presenting Themes Impactful experiences and emotions  Community/Connection Family  Patient Stress Factors Major life changes  Family Stress Factors Exhausted;Health changes;Family relationships  Interventions  Spiritual Care Interventions Made Compassionate presence;Established relationship of care and support  Intervention Outcomes  Outcomes Awareness of support;Awareness of health   Chaplain responded to a call, medical staff was actively caring for the Pt at time of the visit. Chaplain remain present with the family, offering emotional and spiritual support as they spent time with their loved one. Chaplain provided the Pt's daughter with a placement card and shared that chaplain services remain available for ongoing support. "

## 2024-04-05 NOTE — Consult Note (Signed)
 Reason for Consult: Renal failure Consulting physician: Dr. 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 - Poor long term candidate and uncertain that pt will recover even with dialysis. Would need CRRT if all therapies are desired; I tried reaching out to the daughter Karna [phone] but went to voicemail. - Would not recommend dialysis given prognosis and stage IV malignancy on palliative chemotherapy but certainly reasonable temporizing measure to allow rest of family to arrive. -Monitor Daily I/Os, Daily weight  -Maintain MAP>65 for optimal renal perfusion.  - Avoid nephrotoxic agents such as IV contrast, NSAIDs, and phosphate containing bowel preps (FLEETS)  Refractory hyperkalemia noted in prior hospitalization as well as outpatient visits discharge with Lokelma.  For definitive therapy will need dialysis this admission. H/o PE with extension into the right pulmonary artery on Eliquis, lower dose because of anemia. Uterine cancer for failing multiple chemotherapy regimens currently on palliative chemotherapy. Anemia - Transfuse as needed    HPI: Loretta Rangel is an 73 y.o. female with a history of pulmonary embolus bilaterally extending into the right pulmonary artery status post thromboembolectomy 01/26/2024 on Eliquis, recurrent serous endometrial carcinoma initially diagnosed in 11/2022 status post TAH/BSO 01/10/2023;  patient treated with paclitaxel carboplatin 01/2023 with later imaging 04/10/2023 showing a mass in the surgical  bed possibly involving the rectum, sigmoid colon, external iliac extension of the mass and nodules concerning for metastatic disease.  Patient then treated with Taxol/carboplatin from 05/10/2023 - 08/25/2023 with repeat imaging 10/23/2023 showing widespread with peritoneal disease.  Patient then started on doxorubicin 11/07/2023 for 2 cycles with follow-up imaging showing likely disease progression with moderate right-sided hydronephrosis and dilation of the left ureter.  Patient then transitioned to palliative chemo with gemcitabine and cisplatin.  Patient was admitted with anemia requiring transfusion as well as acute renal failure in 02/2024 with CR 2.02 and potassium 5.2 also seen by urology with placement of a right ureteral stent.  Patient also required a paracentesis for malignant ascites.  Patient also discharged with Lokelma for hyperkalemia.  Creatinine peaked at 1.95 on 11/5 before being discharged with a creatinine of 1-1.2.  EMS found the patient pulseless and apneic with family and from the fire department performing CPR for 10 to 15 minutes, 2 rounds of CPR before achieving ROSC.  Patient had pulses on arrival to the ED but ended up getting coded again for almost 30 minutes.  pH noted to be 7.03, pCO2 45, HCO3 11 potassium 7.9.  Hemoglobin 6.1 blood pressure as low as 57/50 at time of consultation blood pressure was ranging more in the 96-100s systolic range.  ROS Review of systems not obtained due to patient factors.  Chemistry and CBC: Creatinine, Ser  Date/Time Value Ref Range Status  04/05/2024 02:25 PM 4.20 (H) 0.44 - 1.00 mg/dL Final  98/69/7984 92:54 PM 0.82 0.50 - 1.10 mg/dL Final   Recent Labs  Lab 04/05/24 1425 04/05/24 1427 04/05/24 1451  NA 146* 147* 143  K 6.9* 6.9* 7.9*  CL 123*  --   --   GLUCOSE 64*  --   --  BUN 125*  --   --   CREATININE 4.20*  --   --    Recent Labs  Lab 04/05/24 1425 04/05/24 1427 04/05/24 1451  HGB 5.8* 5.4* 6.1*  HCT 17.0* 16.0* 18.0*    Liver Function Tests: No results for input(s): AST, ALT, ALKPHOS, BILITOT, PROT, ALBUMIN in the last 168 hours. No results for input(s): LIPASE, AMYLASE in the last 168 hours. No results for input(s): AMMONIA in the last 168 hours. Cardiac Enzymes: No results for input(s): CKTOTAL, CKMB, CKMBINDEX, TROPONINI in the last 168 hours. Iron Studies: No results for input(s): IRON, TIBC, TRANSFERRIN, FERRITIN in the last 72 hours. PT/INR: @LABRCNTIP (inr:5)  Xrays/Other Studies: ) Results for orders placed or performed during the hospital encounter of 04/05/24 (from the past 48 hours)  CBG monitoring, ED     Status: Abnormal   Collection Time: 04/05/24  2:14 PM  Result Value Ref Range   Glucose-Capillary 34 (LL) 70 - 99 mg/dL    Comment: Glucose reference range applies only to samples taken after fasting for at least 8 hours.   Comment 1 Notify RN   I-stat chem 8, ED (not at [facility], [facility] or [facility])     Status: Abnormal   Collection Time: 04/05/24  2:25 PM  Result Value Ref Range   Sodium 146 (H) 135 - 145 mmol/L   Potassium 6.9 (HH) 3.5 - 5.1 mmol/L   Chloride 123 (H) 98 - 111 mmol/L   BUN 125 (H) 8 - 23 mg/dL   Creatinine, Ser 5.79 (H) 0.44 - 1.00 mg/dL   Glucose, Bld 64 (L) 70 - 99 mg/dL    Comment: Glucose reference range applies only to samples taken after fasting for at least 8 hours.   Calcium , Ion 1.04 (L) 1.15 - 1.40 mmol/L   TCO2 12 (L) 22 - 32 mmol/L   Hemoglobin 5.8 (LL) 12.0 - 15.0 g/dL   HCT 82.9 (L) 63.9 - 53.9 %   Comment NOTIFIED PHYSICIAN   I-Stat CG4 Lactic Acid     Status: Abnormal   Collection Time: 04/05/24  2:27 PM  Result Value Ref Range   Lactic Acid, Venous 8.4 (HH) 0.5 - 1.9 mmol/L   Comment NOTIFIED PHYSICIAN   I-Stat venous blood gas, ED     Status: Abnormal   Collection Time: 04/05/24  2:27 PM  Result Value Ref Range   pH, Ven 6.953 (LL) 7.25 - 7.43   pCO2, Ven 45.1 44 - 60 mmHg   pO2, Ven 92 (H) 32 - 45 mmHg    Bicarbonate 10.0 (L) 20.0 - 28.0 mmol/L   TCO2 11 (L) 22 - 32 mmol/L   O2 Saturation 90 %   Acid-base deficit 20.0 (H) 0.0 - 2.0 mmol/L   Sodium 147 (H) 135 - 145 mmol/L   Potassium 6.9 (HH) 3.5 - 5.1 mmol/L   Calcium , Ion 1.05 (L) 1.15 - 1.40 mmol/L   HCT 16.0 (L) 36.0 - 46.0 %   Hemoglobin 5.4 (LL) 12.0 - 15.0 g/dL   Sample type VENOUS    Comment NOTIFIED PHYSICIAN   CBG monitoring, ED     Status: Abnormal   Collection Time: 04/05/24  2:29 PM  Result Value Ref Range   Glucose-Capillary 143 (H) 70 - 99 mg/dL    Comment: Glucose reference range applies only to samples taken after fasting for at least 8 hours.  I-Stat arterial blood gas, ED [facility] ED, [facility], [facility])     Status: Abnormal   Collection Time: 04/05/24  2:51 PM  Result Value Ref Range   pH, Arterial 7.030 (LL) 7.35 - 7.45   pCO2 arterial 41.3 32 - 48 mmHg   pO2, Arterial 228 (H) 83 - 108 mmHg   Bicarbonate 11.0 (L) 20.0 - 28.0 mmol/L   TCO2 12 (L) 22 - 32 mmol/L   O2 Saturation 99 %   Acid-base deficit 18.0 (H) 0.0 - 2.0 mmol/L   Sodium 143 135 - 145 mmol/L   Potassium 7.9 (HH) 3.5 - 5.1 mmol/L   Calcium , Ion 1.41 (H) 1.15 - 1.40 mmol/L   HCT 18.0 (L) 36.0 - 46.0 %   Hemoglobin 6.1 (LL) 12.0 - 15.0 g/dL   Patient temperature 02.4 F    Collection site RADIAL, ALLEN'S TEST ACCEPTABLE    Drawn by RT    Sample type ARTERIAL    Comment NOTIFIED PHYSICIAN    No results found.  PMH:   Past Medical History:  Diagnosis Date   Hypertension     PSH:   Past Surgical History:  Procedure Laterality Date   CESAREAN SECTION     1979   FINGER SURGERY      Allergies: Allergies[1]  Medications:   Prior to Admission medications  Medication Sig Start Date End Date Taking? Authorizing Provider  Cholecalciferol (D 1000) 25 MCG (1000 UT) capsule Take 1,000 Units by mouth daily. 11/22/22   [provider]  cyanocobalamin (VITAMIN B12) 1000 MCG tablet Take 1,000 mcg by mouth daily.    [provider]   lisinopril -hydrochlorothiazide (ZESTORETIC) 20-12.5 MG tablet Take 1 tablet by mouth daily.    [provider]  medroxyPROGESTERone (PROVERA) 10 MG tablet Take 10 mg by mouth daily. 11/29/22   [provider]  OVER THE COUNTER MEDICATION Place 1 drop into both eyes daily as needed (Tired or red eyes). Rohto eye drops    [provider]    Discontinued Meds:   Medications Discontinued During This Encounter  Medication Reason   propofol  (DIPRIVAN ) 1000 MG/100ML infusion     Social History:  reports that she has never smoked. She does not have any smokeless tobacco history on file. She reports that she does not drink alcohol  and does not use drugs.  Family History:   Family History  Problem Relation Age of Onset   Colon cancer Neg Hx    Breast cancer Neg Hx    Ovarian cancer Neg Hx    Endometrial cancer Neg Hx    Pancreatic cancer Neg Hx    Prostate cancer Neg Hx     Blood pressure 101/61, pulse 76, temperature (!) 97.2 F (36.2 C), temperature source Temporal, resp. rate 16, height 5' 5 (1.651 m), SpO2 100%. General: On ventilator. HEENT: Mucus membranes moist. Respiratory: Lungs clear. Cardiac: Normal S1&S2.  GI/Abd: Soft, normalBS. Distended with ascites. Urogenital: Deferred Musculoskeletal: No palpable edema. Skin: Warm and dry. Neuro: Intubated       MELIA LYNWOOD ORN, MD 04/05/2024, 3:17 PM      [1] No Known Allergies

## 2024-04-05 NOTE — Consult Note (Signed)
 "  NAME:  Loretta Rangel, MRN:  [id number], DOB:  05-Sep-1950, LOS: 0 ADMISSION DATE:  04/05/2024, CONSULTATION DATE:  04/05/24 REFERRING MD:  Dr. , CHIEF COMPLAINT:  cardiac arrest   History of Present Illness:   HPI per medical chart review and family at bedside as patient remains encephalopathic.  43 yoF with PMH as below significant for progressive metastatic Uterine/ endometrial cancer with intra-abdominal metastasis on home hospice followed at [facility] oncology with plans to start palliative gemcitabine and cisplatin in October however has not been able to start due to recurrent anemia.  Recent admit 02/2024 at [facility] for acute on chronic anemia, AKI on CKD3a, and hyperkalemia who presented from home after cardiac arrest.  Unfortunately found apneic and pulseless by family.  CPR started by family, continued by EMS with ROSC after 15 mins.  On arrival to ER, pt again PEA cardiac arrest with VT/ VF s/p defib with ROSC after 21 min after ACLS measures and hyperkalemia temporizing measures.  Temp 97.2.  Remains intubated on epinephrine  and norepinephrine  found to have K 7.9, Hgb 5.4 and 6.1 on istat, sCr 4.2, BUN 125, lactic 8.4, glucose 64, EKG with wide complex junctional rhythm. CXR with no acute cardiopulmonary disease.  Nephrology consulted by EDP however poor HD candidate and HD not recommended due to poor prognosis, likely inability to tolerate even CRRT and advanced malignancy.  Per EDP, after discussion with family, they have elected to transition to comfort focused measures, currently awaiting remainder of family to arrive.  PCCM called for admit.  Once at bedside, family confirm remainder of family is less than away in [place] and would like to transition to comfort in the ER without moving patient.   Currently on Epi 25, NE 20, and fentanyl  25.    Pertinent  Medical History  Metastatic Uterine/ endometrial cancer with intra-abdominal metastasis  CKD Essential hypertension   Vitamin D deficiency  Cancer related pain  Constipation  Iron deficiency Anemia Bilateral hydronephrosis, prior right JJ stent 10/25 Hypomagnesemia  Hyperkalemia  Malignant Ascites  History of pulmonary embolus not on AC due to anemia  Significant Hospital Events: Including procedures, antibiotic start and stop dates in addition to other pertinent events    Interim History / Subjective:   Objective    Blood pressure 101/61, pulse 76, temperature (!) 97.2 F (36.2 C), temperature source Temporal, resp. rate 16, height 5' 5 (1.651 m), SpO2 100%.    Vent Mode: PRVC FiO2 (%):  [60 %] 60 % Set Rate:  [16 bmp] 16 bmp Vt Set:  [450 mL] 450 mL PEEP:  [5 cmH20] 5 cmH20   Intake/Output Summary (Last 24 hours) at 04/05/2024 1614 Last data filed at 04/05/2024 1510 Gross per 24 hour  Intake 111.17 ml  Output --  Net 111.17 ml   There were no vitals filed for this visit.  Examination: General:  critically ill appearing elderly female in NAD HEENT: MM pale/dry, ETT, pupils 3/nr Neuro: unresponsive CV: rr, 1 dp, port right chest- not accessed PULM:  MV supported, agonal breathing, coarse, scattered rhonchi on left GI:  protuberant, firm, no BS, foley- tea colored, some bright red blood noted in peritoneal area Extremities: cool/dry, no LE edema, left tibial IO Skin: pale  Resolved problem list   Assessment and Plan   PEA/ VT/ VF cardiac arrest Shock- undifferentiated, likely multifactorial   Lactic acidosis Refractory Hyperkalemia Severe metabolic acidosis  Acute hypoxic respiratory failure  Metabolic Encephalopathy, can't rule out anoxic injury with  prolonged arrest Acute on chronic anemia AKI on CKD3a Hypernatremia Hypoglycemia Metastatic Uterine/ endometrial cancer P:  - plans to transition to full comfort care in ER per family wishes.  High risk for re-arrest due to hyperkalemia prior to transition of comfort.  Cont fentanyl  gtt, prn ativan , robinul .  When  remainder of family arrives, if pt comfortable on SBT, proceed with palliative one way extubation then discontinue vasopressor support.  No escalation of care, transfusions, labs or imaging.  Ongoing emotional support, chaplin at beside.     Labs   CBC: Recent Labs  Lab 04/05/24 1425 04/05/24 1427 04/05/24 1451  HGB 5.8* 5.4* 6.1*  HCT 17.0* 16.0* 18.0*    Basic Metabolic Panel: Recent Labs  Lab 04/05/24 1425 04/05/24 1427 04/05/24 1451  NA 146* 147* 143  K 6.9* 6.9* 7.9*  CL 123*  --   --   GLUCOSE 64*  --   --   BUN 125*  --   --   CREATININE 4.20*  --   --    GFR: CrCl cannot be calculated (Unknown ideal weight.). Recent Labs  Lab 04/05/24 1427  LATICACIDVEN 8.4*    Liver Function Tests: No results for input(s): AST, ALT, ALKPHOS, BILITOT, PROT, ALBUMIN in the last 168 hours. No results for input(s): LIPASE, AMYLASE in the last 168 hours. No results for input(s): AMMONIA in the last 168 hours.  ABG    Component Value Date/Time   PHART 7.030 (LL) 04/05/2024 1451   PCO2ART 41.3 04/05/2024 1451   PO2ART 228 (H) 04/05/2024 1451   HCO3 11.0 (L) 04/05/2024 1451   TCO2 12 (L) 04/05/2024 1451   ACIDBASEDEF 18.0 (H) 04/05/2024 1451   O2SAT 99 04/05/2024 1451     Coagulation Profile: No results for input(s): INR, PROTIME in the last 168 hours.  Cardiac Enzymes: No results for input(s): CKTOTAL, CKMB, CKMBINDEX, TROPONINI in the last 168 hours.  HbA1C: No results found for: HGBA1C  CBG: Recent Labs  Lab 04/05/24 1414 04/05/24 1429  GLUCAP 34* 143*    Review of Systems:   Unable   Past Medical History:  She,  has a past medical history of Hypertension.   Surgical History:   Past Surgical History:  Procedure Laterality Date   CESAREAN SECTION     1979   FINGER SURGERY       Social History:   reports that she has never smoked. She does not have any smokeless tobacco history on file. She reports that she does not  drink alcohol  and does not use drugs.   Family History:  Her family history is negative for Colon cancer, Breast cancer, Ovarian cancer, Endometrial cancer, Pancreatic cancer, and Prostate cancer.   Allergies Allergies[1]   Home Medications  Prior to Admission medications  Medication Sig Start Date End Date Taking? Authorizing Provider  Cholecalciferol (D 1000) 25 MCG (1000 UT) capsule Take 1,000 Units by mouth daily. 11/22/22   [provider]  cyanocobalamin (VITAMIN B12) 1000 MCG tablet Take 1,000 mcg by mouth daily.    [provider]  lisinopril -hydrochlorothiazide (ZESTORETIC) 20-12.5 MG tablet Take 1 tablet by mouth daily.    [provider]  medroxyPROGESTERone (PROVERA) 10 MG tablet Take 10 mg by mouth daily. 11/29/22   [provider]  OVER THE COUNTER MEDICATION Place 1 drop into both eyes daily as needed (Tired or red eyes). Rohto eye drops    [provider]         CRITICAL CARE Performed by:  Lyle Pesa   Total critical care time: 50 minutes  Critical care time was exclusive of separately billable procedures and treating other patients.  Critical care was necessary to treat or prevent imminent or life-threatening deterioration.  Critical care was time spent personally by me on the following activities: development of treatment plan with patient and/or surrogate as well as nursing, discussions with consultants, evaluation of patient's response to treatment, examination of patient, obtaining history from patient or surrogate, ordering and performing treatments and interventions, ordering and review of laboratory studies, ordering and review of radiographic studies, pulse oximetry and re-evaluation of patient's condition.    Lyle Pesa, NP [facility] 04/05/2024, 4:14 PM  See Amion for pager If no response to pager , please call [phone] until 7pm After 7:00 pm call Elink   [phone]          [1] No Known Allergies  "

## 2024-04-05 NOTE — ED Notes (Signed)
 The pt's daughter became concerned about her father's mental state due to pt being transported without him being present during transport. This narrator informed the transported when the husband comes back to avoid escalating any issues.

## 2024-04-05 NOTE — Progress Notes (Signed)
 [facility] Liaison Note   Loretta Rangel is a 73 year old female with a terminal diagnosis of endometrial cancer presenting emergency department with a witnessed arrest at home. Pcg called [facility] triage reporting patient was not breathing. Patient is a full code and triage instructed her to call 911.  Once at the hospital goals of care discussion with family members who initially wanted everything done. Workup concerning for patient hyperkalemia causing arrest. After further conversations, family wishes comfort measures.  Dr Catarina Monguilod states this is a related admission.   Visited patient bedside.  No family present.  Liaison left voice message for pcg.    V/S: 101/61, 76, 97.2, 16 Ventilator support, SpO2 100%  I/O:  199.2   Abnormal Labs: Hct 18, Hgb 6.1, Potassium 7.9  Diagnostics: Portable Chest View IMPRESSION: Endotracheal and nasogastric tubes are in grossly good position. No acute cardiopulmonary disease. Electronically Signed   By: Lynwood Landy Raddle M.D.   On: 04/05/2024 15:49   IV/PRN medications:  Epinephrine  5mg /273ml, Norepinephrine  4mg /282ml, fentanyl  2581mcg/250ml, Fentanyl  bolus, propofol  1000mg /146ml, lorazepam  Inj 2-4mg , Mag Sulfate IVPB 2g/50ml, Epinephrine  1mg /13ml Inj x 4, Amiodarone  1.5mg /ml x2, Atropine  1mg /81ml inj, Sodium Bicarbonate  , Calcium  chloride 1g, Dextrose  50% IV   Recommendations/Plan per Caron Salt, DO progress note dated 12.26.2025: Further goals of care discussion with family and they do not want escalation of care, but want to keep things as is until further family could arrive. After further discussion with family and the ICU team would like to make patient comfort measures/withdrawal care after other family arrives.   Discharge Planning:    Family contact: Karna Bihari, daughter, left voice message   IDT: updated   Goals of care: full comfort measures once family arrives   Please call with any hospice  questions or concerns.   Inocente Jacobs, BSN, [facility] Liaison [phone]

## 2024-04-07 LAB — BPAM RBC
Blood Product Expiration Date: 202601232359
Blood Product Expiration Date: 202601232359
Unit Type and Rh: 5100
Unit Type and Rh: 5100

## 2024-04-07 LAB — TYPE AND SCREEN
ABO/RH(D): O POS
Antibody Screen: NEGATIVE
Unit division: 0
Unit division: 0

## 2024-04-08 MED FILL — Calcium Chloride Inj 10%: INTRAVENOUS | Qty: 10 | Status: AC

## 2024-04-08 MED FILL — Fentanyl Citrate-NaCl 0.9% IV Soln 2.5 MG/250ML: INTRAVENOUS | Qty: 250 | Status: AC

## 2024-04-11 NOTE — Death Summary Note (Signed)
 "  DEATH SUMMARY   Patient Details  Name: Loretta Rangel MRN: [id number] DOB: Jul 23, 1950 PCP:Pcp, No Admission/Discharge Information   Admit Date:  2024-05-05  Date of Death: Date of Death: May 06, 2024  Time of Death: Time of Death: 0900  Length of Stay: 0   Principle Cause of death: Cardiac arrest in the setting of hyperkalemia renal failure w.  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 Initial rhythm PEA and EMS performed 2 rounds of CPR and achieved ROSC and in ED was hypotensive,became pulseless again and CPR was resumed-appeared to be in V. tach/V-fib and was cardioverted and defibrillated and given amiodarone -ROSC achieved after approximately 16 m. Labs>>worsening renal function (BUN 125, creatinine 4.2), severe hyperkalemia (potassium 7.9), severe metabolic acidosis (bicarb 12 and pH 6.95 on blood gas), severe anemia (hemoglobin 5.8), severe lactic acidosis (lactate 8.4), hypoglycemia (CBG 34 on arrival and 64 on i-STAT chemistry), hypernatremia (sodium 147), and hyperchloremia (chloride 123). S/p temporizing measures with insulin /dextrose  and albuterol  and started on epinephrine  and Levophed  drip. Nephrology and critical care were consulted.Nephrology felt that the patient was not a candidate for dialysis given prognosis and advanced malignancy.  Critical care evaluated the patient and after further goals of care discussion, family decided to transition the patient to full comfort care patient was extubated and  vasopressors stopped and admitted to floor for end-of-life care.\ Patient passed a peacefully with family at bedside this morning before being seen  Subjective: Seen  husband in hallway crying, patient had  just passed away this am Provided therapeutic listening, comforted him and he feels at ease knowing that she is pain-free and she is at peace and has gone to meet the lord.  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 Patient status is: Remains hospitalized because of severity of illness Level of care: Med-Surg   Objective: Vitals last 24 hrs: Vitals:   05-05-2024 1800 May 05, 2024 1805 2024/05/05 1810 05-06-24 0510  BP:    (!) 59/39  Pulse: 92 92  71  Resp: (!) 21 14 11  (!) 22  Temp:    99.5 F (37.5 C)  TempSrc:      SpO2: 98% 94%  92%  Height:        Physical Examination: Patient is obtunded full exam deferred for comfort  Medications reviewed:   Scheduled Meds:  sodium chloride    Intravenous Once  Continuous Infusions:  sodium chloride        Assessment and Plan: No notes have been filed under this hospital service. Service: Hospitalist   Procedures: see note  Consultations: Critical care, nephrology  The results of significant diagnostics from this hospitalization (including imaging, microbiology, ancillary and laboratory) are listed below for reference.   Significant Diagnostic Studies: DG Chest Portable 1 View Result Date: 04/05/2024 CLINICAL DATA:  Cardiac arrest EXAM: PORTABLE CHEST 1 VIEW COMPARISON:  None Available. FINDINGS: The heart size and mediastinal contours are within normal limits. Endotracheal and nasogastric tubes are in grossly good position. Right internal jugular Port-A-Cath is noted with distal tip in right atrium. Both lungs are clear. The visualized skeletal structures are unremarkable. IMPRESSION: Endotracheal and nasogastric tubes are in grossly good position. No acute cardiopulmonary disease. Electronically Signed   By: Lynwood Landy Raddle M.D.   On: 04/05/2024 15:49    Microbiology: No results found for this or any previous visit (from the past 240 hours).  Time spent: 0 minutes  Signed: Mennie LAMY, MD April 12, 2024   "

## 2024-04-11 NOTE — Progress Notes (Signed)
 Called [facility] and patient is determined not eligible for organ donation Referral number [id number] spoken to Lauren Didomizio.

## 2024-04-11 NOTE — Progress Notes (Signed)
 Patient arrived from [facility] to [phone] with fentanyl  drip. Order was cancelled. Remaining fentanyl  was wasted in stericycle with Hadassah Deters, CN - 125cc.

## 2024-04-11 NOTE — Hospital Course (Addendum)
 Loretta Rangel is a 74 y.o. 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 Initial rhythm PEA and EMS performed 2 rounds of CPR and achieved ROSC and in ED was hypotensive,became pulseless again and CPR was resumed-appeared to be in V. tach/V-fib and was cardioverted and defibrillated and given amiodarone -ROSC achieved after approximately 16 m. Labs>>worsening renal function (BUN 125, creatinine 4.2), severe hyperkalemia (potassium 7.9), severe metabolic acidosis (bicarb 12 and pH 6.95 on blood gas), severe anemia (hemoglobin 5.8), severe lactic acidosis (lactate 8.4), hypoglycemia (CBG 34 on arrival and 64 on i-STAT chemistry), hypernatremia (sodium 147), and hyperchloremia (chloride 123). S/p temporizing measures with insulin /dextrose  and albuterol  and started on epinephrine  and Levophed  drip. Nephrology and critical care were consulted.Nephrology felt that the patient was not a candidate for dialysis given prognosis and advanced malignancy.  Critical care evaluated the patient and after further goals of care discussion, family decided to transition the patient to full comfort care patient was extubated and vasopressors stopped and admitted to floor for end-of-life care.\ Patient passed a peacefully with family at bedside this morning before being seen  Subjective: Seen  husband in hallway crying, patient had  just passed away this am Provided therapeutic listening, comforted him and he feels at ease knowing that she is pain-free and she is at peace and has gone to meet the lord.  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 Patient status is: Remains hospitalized because of severity of illness Level of care: Med-Surg   Objective: Vitals last 24 hrs: Vitals:   04/05/24 1800 04/05/24 1805 04/05/24 1810 April 15, 2024 0510  BP:    (!) 59/39  Pulse: 92 92  71  Resp: (!) 21 14 11  (!) 22  Temp:    99.5 F (37.5 C)  TempSrc:      SpO2: 98% 94%  92%  Height:        Physical Examination: Patient is obtunded full exam deferred for comfort  Medications reviewed:  Scheduled Meds:  sodium chloride    Intravenous Once   Continuous Infusions:  sodium chloride 

## 2024-04-11 DEATH — deceased
